# Patient Record
Sex: Male | Born: 1950 | Race: White | Hispanic: No | Marital: Single | Smoking: Never smoker
Health system: Southern US, Community
[De-identification: ages and names within clinical notes are randomized; demographics above are authoritative.]

## PROBLEM LIST (undated history)

## (undated) DIAGNOSIS — T148XXA Other injury of unspecified body region, initial encounter: Secondary | ICD-10-CM

## (undated) DIAGNOSIS — I82409 Acute embolism and thrombosis of unspecified deep veins of unspecified lower extremity: Secondary | ICD-10-CM

## (undated) DIAGNOSIS — T7840XA Allergy, unspecified, initial encounter: Secondary | ICD-10-CM

## (undated) HISTORY — DX: Allergy, unspecified, initial encounter: T78.40XA

## (undated) HISTORY — DX: Acute embolism and thrombosis of unspecified deep veins of unspecified lower extremity: I82.409

## (undated) HISTORY — PX: COLONOSCOPY: SHX174

## (undated) HISTORY — DX: Other injury of unspecified body region, initial encounter: T14.8XXA

---

## 1982-04-16 HISTORY — PX: SHOULDER SURGERY: SHX246

## 1999-04-17 HISTORY — PX: LASIK: SHX215

## 1999-11-20 ENCOUNTER — Ambulatory Visit (HOSPITAL_COMMUNITY): Admission: RE | Admit: 1999-11-20 | Discharge: 1999-11-20 | Payer: Self-pay | Admitting: Orthopaedic Surgery

## 2001-05-30 ENCOUNTER — Encounter: Admission: RE | Admit: 2001-05-30 | Discharge: 2001-05-30 | Payer: Self-pay | Admitting: Sports Medicine

## 2002-04-16 HISTORY — PX: KNEE ARTHROPLASTY: SHX992

## 2002-10-25 ENCOUNTER — Encounter: Payer: Self-pay | Admitting: Emergency Medicine

## 2002-10-25 ENCOUNTER — Emergency Department (HOSPITAL_COMMUNITY): Admission: EM | Admit: 2002-10-25 | Discharge: 2002-10-25 | Payer: Self-pay | Admitting: Emergency Medicine

## 2006-05-03 ENCOUNTER — Encounter: Payer: Self-pay | Admitting: Vascular Surgery

## 2006-05-03 ENCOUNTER — Ambulatory Visit (HOSPITAL_COMMUNITY): Admission: RE | Admit: 2006-05-03 | Discharge: 2006-05-03 | Payer: Self-pay | Admitting: Family Medicine

## 2006-05-16 ENCOUNTER — Ambulatory Visit: Payer: Self-pay | Admitting: Oncology

## 2006-09-13 ENCOUNTER — Ambulatory Visit: Payer: Self-pay | Admitting: Oncology

## 2006-09-17 ENCOUNTER — Ambulatory Visit: Admission: RE | Admit: 2006-09-17 | Discharge: 2006-09-17 | Payer: Self-pay | Admitting: Oncology

## 2006-09-17 ENCOUNTER — Ambulatory Visit: Payer: Self-pay | Admitting: Vascular Surgery

## 2006-09-19 LAB — CARDIOLIPIN ANTIBODIES, IGG, IGM, IGA: Anticardiolipin IgG: 8 [GPL'U] (ref <11)

## 2006-09-19 LAB — D-DIMER, QUANTITATIVE: D-Dimer, Quant: 0.22 ug/mL-FEU (ref 0.00–0.48)

## 2006-09-19 LAB — HOMOCYSTEINE: Homocysteine: 7.3 umol/L (ref 4.0–15.4)

## 2006-09-19 LAB — FACTOR 8 ASSAY: Coagulation Factor VIII: 388 % — ABNORMAL HIGH (ref 57–124)

## 2007-01-14 ENCOUNTER — Ambulatory Visit: Payer: Self-pay | Admitting: Oncology

## 2007-01-16 LAB — PROTIME-INR: Protime: 24 Seconds — ABNORMAL HIGH (ref 10.6–13.4)

## 2007-01-17 LAB — FACTOR 8 ASSAY: Coagulation Factor VIII: 166 % — ABNORMAL HIGH (ref 73–140)

## 2007-04-16 ENCOUNTER — Ambulatory Visit: Payer: Self-pay | Admitting: Oncology

## 2007-04-24 LAB — FACTOR 8 ASSAY: Coagulation Factor VIII: 133 % (ref 73–140)

## 2007-06-04 ENCOUNTER — Ambulatory Visit: Payer: Self-pay | Admitting: Oncology

## 2007-11-04 ENCOUNTER — Ambulatory Visit: Payer: Self-pay | Admitting: Oncology

## 2007-11-07 LAB — D-DIMER, QUANTITATIVE: D-Dimer, Quant: 0.5 ug/mL-FEU — ABNORMAL HIGH (ref 0.00–0.48)

## 2014-12-06 ENCOUNTER — Ambulatory Visit: Payer: Self-pay | Admitting: Family Medicine

## 2016-02-17 ENCOUNTER — Encounter: Payer: Self-pay | Admitting: Neurology

## 2016-02-17 ENCOUNTER — Ambulatory Visit (INDEPENDENT_AMBULATORY_CARE_PROVIDER_SITE_OTHER): Payer: BLUE CROSS/BLUE SHIELD | Admitting: Neurology

## 2016-02-17 VITALS — BP 119/80 | HR 70 | Ht 75.0 in | Wt 201.0 lb

## 2016-02-17 DIAGNOSIS — G510 Bell's palsy: Secondary | ICD-10-CM

## 2016-02-17 DIAGNOSIS — S069X9A Unspecified intracranial injury with loss of consciousness of unspecified duration, initial encounter: Secondary | ICD-10-CM | POA: Insufficient documentation

## 2016-02-17 DIAGNOSIS — H4921 Sixth [abducent] nerve palsy, right eye: Secondary | ICD-10-CM | POA: Diagnosis not present

## 2016-02-17 DIAGNOSIS — G509 Disorder of trigeminal nerve, unspecified: Secondary | ICD-10-CM | POA: Diagnosis not present

## 2016-02-17 DIAGNOSIS — S069XAA Unspecified intracranial injury with loss of consciousness status unknown, initial encounter: Secondary | ICD-10-CM | POA: Insufficient documentation
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
## 2017-04-12 ENCOUNTER — Ambulatory Visit
Admission: RE | Admit: 2017-04-12 | Discharge: 2017-04-12 | Disposition: A | Discharge status: Home / Self Care | Payer: Self-pay | Source: Ambulatory Visit | Attending: Family Medicine | Admitting: Family Medicine

## 2017-04-12 ENCOUNTER — Other Ambulatory Visit: Payer: Self-pay | Admitting: Family Medicine

## 2017-04-12 DIAGNOSIS — J111 Influenza due to unidentified influenza virus with other respiratory manifestations: Secondary | ICD-10-CM

## 2017-05-09 LAB — LIPID PANEL
Cholesterol: 174 (ref 0–200)
HDL: 77 — AB (ref 35–70)
Triglycerides: 75 (ref 40–160)

## 2017-05-09 LAB — BASIC METABOLIC PANEL
BUN: 20 (ref 4–21)
Creatinine: 1.1 (ref 0.6–1.3)
GLUCOSE: 94
Potassium: 4.5 (ref 3.4–5.3)
Sodium: 140 (ref 137–147)

## 2017-05-09 LAB — HEPATIC FUNCTION PANEL
ALK PHOS: 58 (ref 25–125)
ALT: 10 (ref 10–40)
AST: 19 (ref 14–40)
BILIRUBIN, TOTAL: 0.9

## 2017-10-29 LAB — CBC AND DIFFERENTIAL
NEUTROS ABS: 4
WBC: 6.1

## 2017-11-13 ENCOUNTER — Encounter: Payer: Self-pay | Admitting: Internal Medicine

## 2017-11-13 ENCOUNTER — Ambulatory Visit: Payer: BLUE CROSS/BLUE SHIELD | Admitting: Internal Medicine

## 2017-11-13 ENCOUNTER — Other Ambulatory Visit (INDEPENDENT_AMBULATORY_CARE_PROVIDER_SITE_OTHER): Payer: BLUE CROSS/BLUE SHIELD

## 2017-11-13 ENCOUNTER — Ambulatory Visit (INDEPENDENT_AMBULATORY_CARE_PROVIDER_SITE_OTHER)
Admission: RE | Admit: 2017-11-13 | Discharge: 2017-11-13 | Disposition: A | Discharge status: Home / Self Care | Payer: BLUE CROSS/BLUE SHIELD | Source: Ambulatory Visit | Attending: Internal Medicine | Admitting: Internal Medicine

## 2017-11-13 VITALS — BP 134/78 | HR 63 | Ht 75.0 in | Wt 198.6 lb

## 2017-11-13 DIAGNOSIS — R059 Cough, unspecified: Secondary | ICD-10-CM

## 2017-11-13 DIAGNOSIS — J45991 Cough variant asthma: Secondary | ICD-10-CM

## 2017-11-13 DIAGNOSIS — R05 Cough: Secondary | ICD-10-CM | POA: Diagnosis not present

## 2017-11-13 LAB — CBC WITH DIFFERENTIAL/PLATELET
BASOS PCT: 0.5 % (ref 0.0–3.0)
Basophils Absolute: 0 10*3/uL (ref 0.0–0.1)
EOS ABS: 0.1 10*3/uL (ref 0.0–0.7)
Eosinophils Relative: 1.4 % (ref 0.0–5.0)
HCT: 42.9 % (ref 39.0–52.0)
HEMOGLOBIN: 14.7 g/dL (ref 13.0–17.0)
LYMPHS PCT: 29.8 % (ref 12.0–46.0)
Lymphs Abs: 1.9 10*3/uL (ref 0.7–4.0)
MCHC: 34.3 g/dL (ref 30.0–36.0)
MCV: 89.5 fl (ref 78.0–100.0)
Monocytes Absolute: 0.4 10*3/uL (ref 0.1–1.0)
Monocytes Relative: 7.1 % (ref 3.0–12.0)
NEUTROS ABS: 3.9 10*3/uL (ref 1.4–7.7)
Neutrophils Relative %: 61.2 % (ref 43.0–77.0)
PLATELETS: 159 10*3/uL (ref 150.0–400.0)
RBC: 4.8 Mil/uL (ref 4.22–5.81)
RDW: 14.3 % (ref 11.5–15.5)
WBC: 6.3 10*3/uL (ref 4.0–10.5)

## 2017-11-13 LAB — NITRIC OXIDE: Nitric Oxide: 19

## 2017-11-13 MED ORDER — BUDESONIDE-FORMOTEROL FUMARATE 160-4.5 MCG/ACT IN AERO: 2.0000 | INHALATION_SPRAY | Freq: Two times a day (BID) | RESPIRATORY_TRACT | 0 refills | Status: DC

## 2017-11-13 NOTE — Patient Instructions (Addendum)
symbiocort 160 2 pffs just at bedtime  Work on inhaler technique:  relax and gently blow all the way out then take a nice smooth deep breath back in, triggering the inhaler at same time you start breathing in.  Hold for up to 5 seconds if you can. Blow out thru nose. Rinse and gargle with water when done     Please see patient coordinator before you leave today  to schedule Ct Sinus and I will call results    Please remember to go to the lab and x-ray department downstairs in the basement  for your tests - we will call you with the results when they are available.   Please schedule a follow up office visit in 4 weeks, sooner if needed
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
## 2017-11-14 ENCOUNTER — Encounter: Payer: Self-pay | Admitting: Internal Medicine

## 2017-11-14 LAB — RESPIRATORY ALLERGY PROFILE REGION II ~~LOC~~
Allergen, D pternoyssinus,d7: <0.1 kU/L
Allergen, Mouse Urine Protein, e78: <0.1 kU/L
Allergen, Oak,t7: <0.1 kU/L
Allergen, P. notatum, m1: <0.1 kU/L
Box Elder IgE: <0.1 kU/L
CLASS: 0
CLASS: 0
CLASS: 0
CLASS: 0
CLASS: 0
CLASS: 0
CLASS: 0
CLASS: 0
CLASS: 0
CLASS: 0
CLASS: 0
CLASS: 0
CLASS: 0
Class: 0
Class: 0
Class: 0
Class: 0
Class: 0
Class: 0
Class: 0
Class: 0
Class: 0
Class: 0
Class: 0
Cockroach: <0.1 kU/L
D. farinae: <0.1 kU/L
Elm IgE: <0.1 kU/L
IgE (Immunoglobulin E), Serum: <2 kU/L (ref <=114)
Johnson Grass: <0.1 kU/L
Timothy Grass: <0.1 kU/L

## 2017-11-14 LAB — INTERPRETATION:
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
## 2017-11-14 NOTE — Progress Notes (Signed)
Spoke with pt and notified of results per Dr. Wert. Pt verbalized understanding and denied any questions. 

## 2017-11-22 ENCOUNTER — Ambulatory Visit (INDEPENDENT_AMBULATORY_CARE_PROVIDER_SITE_OTHER)
Admission: RE | Admit: 2017-11-22 | Discharge: 2017-11-22 | Disposition: A | Discharge status: Home / Self Care | Payer: Medicare HMO | Source: Ambulatory Visit | Attending: Internal Medicine | Admitting: Internal Medicine

## 2017-11-22 DIAGNOSIS — J0100 Acute maxillary sinusitis, unspecified: Secondary | ICD-10-CM | POA: Diagnosis not present

## 2017-11-22 DIAGNOSIS — J45991 Cough variant asthma: Secondary | ICD-10-CM | POA: Diagnosis not present

## 2017-11-22 DIAGNOSIS — R05 Cough: Secondary | ICD-10-CM

## 2017-11-22 DIAGNOSIS — R059 Cough, unspecified: Secondary | ICD-10-CM

## 2017-11-22 NOTE — Progress Notes (Signed)
Spoke with pt and notified of results per Dr. Wert. Pt verbalized understanding and denied any questions. 

## 2017-12-12 ENCOUNTER — Encounter: Payer: Self-pay | Admitting: Internal Medicine

## 2017-12-12 ENCOUNTER — Telehealth: Payer: Self-pay | Admitting: Internal Medicine

## 2017-12-12 ENCOUNTER — Ambulatory Visit: Payer: BLUE CROSS/BLUE SHIELD | Admitting: Internal Medicine

## 2017-12-12 VITALS — BP 120/64 | HR 64 | Ht 73.75 in | Wt 192.2 lb

## 2017-12-12 DIAGNOSIS — Z23 Encounter for immunization: Secondary | ICD-10-CM

## 2017-12-12 DIAGNOSIS — Z0000 Encounter for general adult medical examination without abnormal findings: Secondary | ICD-10-CM

## 2017-12-12 DIAGNOSIS — J45991 Cough variant asthma: Secondary | ICD-10-CM

## 2017-12-12 MED ORDER — BUDESONIDE-FORMOTEROL FUMARATE 80-4.5 MCG/ACT IN AERO: INHALATION_SPRAY | RESPIRATORY_TRACT | 12 refills | Status: DC

## 2017-12-12 NOTE — Telephone Encounter (Signed)
Clinical staff did not call this patient today.  Pt did have a referral placed for PCP today- PCCs please advise if you contacted pt regarding this. I see no other reason why our office would have called pt today.  Thanks!

## 2017-12-12 NOTE — Patient Instructions (Addendum)
Symbicort 80 up to 2 puffs every 12 hours as needed    For cough > mucinex dm up to 1200 mg every 12 hours as needed   Please see patient coordinator before you leave today  to schedule internal medicine for health maintence   You will be given a regular flu shot and Prevnar 13 today    If you are satisfied with your treatment plan,  let your doctor know and he/she can either refill your medications or you can return here when your prescription runs out.     If in any way you are not 100% satisfied,  please tell us.  If 100% better, tell your friends!  Pulmonary follow up is as needed
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
## 2017-12-12 NOTE — Telephone Encounter (Signed)
Spoke to pt he is aware he will here from [facility] for an appt with them he is also aware his sinus ct has been rebilled Tobe SosSally E Ottinger

## 2017-12-16 ENCOUNTER — Encounter: Payer: Self-pay | Admitting: Internal Medicine

## 2017-12-16 NOTE — Assessment & Plan Note (Signed)
FENO 11/13/2017  =   19 - Spirometry 11/13/2017   wnl - 11/13/2017  try symb 160 just at bedtime x 4 weeks then return > pt changed to prn symbicort on his own - Allergy profile 11/13/2017 >  Eos 0.1  /  IgE  < 2   Rast neg   - Sinus CT 11/22/2017 >>> Mild left frontal sinus mucosal thickening.  - 12/12/2017  After extensive coaching inhaler device,  effectiveness =  90%    Marked improvement using symbicort "prn"  This is actually reasonable  Based on two studies from NEJM  378; 20 p 1865 (2018) and 380 : p2020-30 (2019) in pts with mild asthma it is reasonable to use low dose symbicort eg 80 2bid "prn" flare in this setting but I emphasized this was only shown with symbicort and takes advantage of the rapid onset of action but is not the same as "rescue therapy" but can be stopped once the acute symptoms have resolved and the need for rescue has been minimized (< 2 x weekly)    Advised pulmonary f/u can be prn at this point but needs to establish with PCP > referred    I had an extended discussion with the patient reviewing all relevant studies completed to date and  lasting 15 to 20 minutes of a 25 minute visit    See device teaching which extended face to face time for this visit.  Each maintenance medication was reviewed in detail including emphasizing most importantly the difference between maintenance and prns and under what circumstances the prns are to be triggered using an action plan format that is not reflected in the computer generated alphabetically organized AVS which I have not found useful in most complex patients, especially with respiratory illnesses  Please see AVS for specific instructions unique to this visit that I personally wrote and verbalized to the the pt in detail and then reviewed with pt  by my nurse highlighting any  changes in therapy recommended at today's visit to their plan of care.

## 2017-12-16 NOTE — Assessment & Plan Note (Signed)
Referred to [facility] primary care.

## 2017-12-27 DIAGNOSIS — R69 Illness, unspecified: Secondary | ICD-10-CM | POA: Diagnosis not present

## 2018-03-05 ENCOUNTER — Telehealth: Payer: Self-pay | Admitting: Internal Medicine

## 2018-03-05 NOTE — Telephone Encounter (Signed)
Dr. Sherene SiresWert would not process these labs.  Routing to [place] to follow up on.

## 2018-03-10 NOTE — Telephone Encounter (Signed)
Spoke with Marisue IvanLiz in regards to the claim to see if anything needed to be done by us and per Marisue IvanLiz, if the labs were for the respiratory allergy profile that MW ordered, we would need Dr. Sherene SiresWert to write a brief note explaining why he ordered the number of tests he did (that there are many potential allergens that we need to test for). Once this has been done, Marisue IvanLiz will fax that to the lab and request that they appeal this claim for pt.  Dr. Sherene SiresWert, please advise on this for pt. Thanks!

## 2018-03-12 NOTE — Telephone Encounter (Signed)
Dr. Sherene SiresWert please advise once available, thank you.

## 2018-03-17 ENCOUNTER — Encounter: Payer: Self-pay | Admitting: Internal Medicine

## 2018-03-17 NOTE — Telephone Encounter (Signed)
Dr. Wert, please advise on this. Thanks! 

## 2018-03-17 NOTE — Telephone Encounter (Signed)
done

## 2018-03-18 NOTE — Telephone Encounter (Signed)
Marisue IvanLiz, did MW pass the letter to you for patient's labs. Thanks.

## 2018-03-24 NOTE — Telephone Encounter (Signed)
Have not seen any follow up on this patient looks like Dr. Sherene SiresWert has addressed, just need confirmation from Marisue IvanLiz to close message   Will route to her for follow up

## 2018-03-24 NOTE — Telephone Encounter (Signed)
Attempted to call patient, left message.  

## 2018-03-25 NOTE — Telephone Encounter (Signed)
Marisue IvanLiz please advise once you hear back from the patient, thank you.

## 2018-03-28 NOTE — Telephone Encounter (Signed)
Marisue IvanLiz do you have any update regarding this. Thanks.

## 2018-04-02 NOTE — Telephone Encounter (Signed)
Marisue IvanLiz, please advise on this for pt if it has been taken care of. Thanks!

## 2018-04-03 NOTE — Telephone Encounter (Signed)
Recd letter from Dr. Sherene SiresWert, called [facility], s/w Imagene GurneyDariana who gave me fax# [phone], faxed letter to her attention with invoice #[id number]#[phone].

## 2018-04-07 NOTE — Telephone Encounter (Signed)
Wesley Black, is it ok to close this encounter?

## 2018-04-14 ENCOUNTER — Encounter: Payer: Self-pay | Admitting: Family Medicine

## 2018-04-14 ENCOUNTER — Ambulatory Visit (INDEPENDENT_AMBULATORY_CARE_PROVIDER_SITE_OTHER): Payer: Medicare HMO | Admitting: Family Medicine

## 2018-04-14 VITALS — BP 112/72 | HR 54 | Temp 97.6°F | Ht 75.0 in | Wt 196.0 lb

## 2018-04-14 DIAGNOSIS — R229 Localized swelling, mass and lump, unspecified: Secondary | ICD-10-CM

## 2018-04-14 DIAGNOSIS — J329 Chronic sinusitis, unspecified: Secondary | ICD-10-CM

## 2018-04-14 DIAGNOSIS — S069X9S Unspecified intracranial injury with loss of consciousness of unspecified duration, sequela: Secondary | ICD-10-CM | POA: Diagnosis not present

## 2018-04-14 DIAGNOSIS — L739 Follicular disorder, unspecified: Secondary | ICD-10-CM | POA: Insufficient documentation

## 2018-04-14 DIAGNOSIS — R05 Cough: Secondary | ICD-10-CM

## 2018-04-14 DIAGNOSIS — L080 Pyoderma: Secondary | ICD-10-CM | POA: Diagnosis not present

## 2018-04-14 DIAGNOSIS — R059 Cough, unspecified: Secondary | ICD-10-CM | POA: Insufficient documentation

## 2018-04-14 MED ORDER — AZELASTINE-FLUTICASONE 137-50 MCG/ACT NA SUSP: NASAL | 1 refills | Status: DC

## 2018-04-14 NOTE — Progress Notes (Signed)
Subjective:  Wesley Black is a 67 y.o. male who presents today with a chief complaint of sinus congestion and to establish care.  HPI:  Sinus Congestion, chronic problem, new to provider Patient symptoms started several months ago.  Symptoms initially had URI symptoms with cough and congestion.  He was treated with Z-Pak without clear resolution of the symptoms.  A few months later he had persistent cough and was started on Tessalon with no improvement.  He was subsequently referred to pulmonology for chronic cough.  Underwent work-up at that time including chest x-ray which was negative.  He had PFTs done as well which were also negative.  Denies any shortness of breath.  Cough is typically not productive of any sputum.  No fevers.  No chills.  No weight loss.  No orthopnea.  No reflux type symptoms.  Reportedly, his pulmonologist was concerned that he may have sinus issues contributing to his chronic cough.  Patient underwent CT scan of his face which showed mild left frontal sinus mucosal thickening.  Symptoms have been persistent over the last couple of months.  No other treatments tried.  Cyst, chronic problem, new to provider Patient also reports having cysts appear in his bilateral armpits randomly over the past year.  Initially started in his left axilla over a year ago.  He was treated with doxycycline and his symptoms resolved for a couple weeks.  Less the symptoms came back he was treated with topical antibiotic ointment with improvement in his symptoms.  Several months after this, had recurrence of symptoms in his right axilla.  ROS: Per HPI, otherwise a complete review of systems was negative.   PMH:  The following were reviewed and entered/updated in epic: Past Medical History:  Diagnosis Date  . Allergy   . Fracture    Multiple   Patient Active Problem List   Diagnosis Date Noted  . Cough 04/14/2018  . Skin nodule 04/14/2018  . 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 KNEE ARTHROPLASTY Right 2004  . LASIK Bilateral 2001  . SHOULDER SURGERY Left 1984    Family History  Problem Relation Age of Onset  . Hypertension Mother   . Arthritis Mother   . Hyperlipidemia Mother   . Prostate cancer Father   . Cancer Father   . Hyperlipidemia Father   . Hypertension Father   . Prostate cancer Brother   . Obesity Brother   . Asthma Maternal Grandmother   . Hyperlipidemia Maternal Grandfather   . Hypertension Maternal Grandfather   . Stroke Maternal Grandfather   . COPD Paternal Grandfather     Medications- reviewed and updated Current Outpatient Medications  Medication Sig Dispense Refill  . b complex vitamins tablet Take 1 tablet by mouth daily.    . budesonide-formoterol (SYMBICORT) 80-4.5 MCG/ACT inhaler Take 2 puffs first thing in am and then another 2 puffs about 12 hours later. 1 Inhaler 12  . Multiple Vitamin (MULTIVITAMIN) capsule Take 1 capsule by mouth daily.    . Azelastine-Fluticasone (DYMISTA) 137-50 MCG/ACT SUSP Use one spray per nostril twice dauly. 23 g 1   No current facility-administered medications for this visit.     Allergies-reviewed and updated No Known Allergies  Social History   Socioeconomic History  . Marital status: Married    Spouse name: Not on file  . Number of children: 0  . Years of education: Master's  .  Highest education level: Not on file  Occupational History  . Occupation: Retired  Engineer, productionocial Needs  . Financial resource strain: Not on file  . Food insecurity:    Worry: Not on file    Inability: Not on file  . Transportation needs:    Medical: Not on file    Non-medical: Not on file  Tobacco Use  . Smoking status: Never Smoker  . Smokeless tobacco: Never Used  Substance and Sexual Activity  . Alcohol use: Yes    Comment: 2 glasses of wine or beer per month  . Drug use: No  . Sexual activity: Not on file    Comment:  Married  Lifestyle  . Physical activity:    Days per week: Not on file    Minutes per session: Not on file  . Stress: Not on file  Relationships  . Social connections:    Talks on phone: Not on file    Gets together: Not on file    Attends religious service: Not on file    Active member of club or organization: Not on file    Attends meetings of clubs or organizations: Not on file    Relationship status: Not on file  Other Topics Concern  . Not on file  Social History Narrative   Lives at home, separated   Left-handed   Caffeine: 1 glass of tea per day, occasional 7 oz Pepsi (every 2 wks)        Objective:  Physical Exam: BP 112/72 (BP Location: Left Arm, Patient Position: Sitting, Cuff Size: Normal)   Pulse (!) 54   Temp 97.6 F (36.4 C) (Oral)   Ht 6\' 3"  (1.905 m)   Wt 196 lb (88.9 kg)   SpO2 97%   BMI 24.50 kg/m   Gen: NAD, resting comfortably HEENT: TMs clear.  Nasal mucosa normal-appearing.  Mild leftward septal deviation noted. CV: RRR with no murmurs appreciated Pulm: NWOB, CTAB with no crackles, wheezes, or rhonchi GI: Normal bowel sounds present. Soft, Nontender, Nondistended. MSK: No edema, cyanosis, or clubbing noted Skin: 1.5 cm indurated, erythematous nodule in right axilla.  Tender to palpation.  No fluctuance. Neuro: Grossly normal, moves all extremities Psych: Normal affect and thought content  Punch Biopsy Procedure Note After informed written consent was obtained, using Betadine for cleansing and 1% Lidocaine with epinephrine for anesthetic, with sterile technique a 4 mm punch biopsy was used to obtain a biopsy specimen of the lesion. Hemostasis was obtained by silver nitrate and pressure.  Wound was not sutured. Antibiotic dressing is applied, and wound care instructions provided. Be alert for any signs of cutaneous infection. The specimen is labeled and sent to pathology for evaluation. The procedure was well tolerated without  complications.  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  Do not think this is contributing to his other symptoms.  Skin nodule Punch biopsy performed today.  Depending on results may need surgical referral for excision versus ultrasonography.  Cough Multifactorial in setting of cough variant asthma and possible chronic sinusitis.  Will start Dymista nasal spray.  He will continue Symbicort as directed by pulmonology.  Preventative Healthcare Patient was instructed to return soon for CPE.  Obtain records from previous PCP.  Health Maintenance Due  Topic Date Due  . Hepatitis C Screening  09/16/1950  . HIV Screening  12/07/1967  . TETANUS/TDAP  12/07/1971  . COLONOSCOPY  01/23/2011   Katina Degreealeb M. Jimmey RalphParker, MD 04/14/2018 2:17 PM

## 2018-04-14 NOTE — Assessment & Plan Note (Signed)
Punch biopsy performed today.  Depending on results may need surgical referral for excision versus ultrasonography.

## 2018-04-14 NOTE — Assessment & Plan Note (Signed)
Multifactorial in setting of cough variant asthma and possible chronic sinusitis.  Will start Dymista nasal spray.  He will continue Symbicort as directed by pulmonology.

## 2018-04-14 NOTE — Patient Instructions (Addendum)
It was very nice to see you today!  We will take a biopsy of the spot in your armpit. This will come back in about a week or so.  Please keep the area clean and dry.  Please leave the bandage in place for the next 24 hours.  Please try the dymista spray for your sinuses and cough. Let me know if your symptoms do not improve in the next 1-2 weeks.   Take care, Dr Jimmey RalphParker   Skin Biopsy, Care After This sheet gives you information about how to care for yourself after your procedure. Your health care provider may also give you more specific instructions. If you have problems or questions, contact your health care provider. What can I expect after the procedure? After the procedure, it is common to have:  Soreness.  Bruising.  Itching. Follow these instructions at home: Biopsy site care Follow instructions from your health care provider about how to take care of your biopsy site. Make sure you:  Wash your hands with soap and water before and after you change your bandage (dressing). If soap and water are not available, use hand sanitizer.  Apply ointment on your biopsy site as directed by your health care provider.  Change your dressing as told by your health care provider.  Leave stitches (sutures), skin glue, or adhesive strips in place. These skin closures may need to stay in place for 2 weeks or longer. If adhesive strip edges start to loosen and curl up, you may trim the loose edges. Do not remove adhesive strips completely unless your health care provider tells you to do that.  If the biopsy area bleeds, apply gentle pressure for 10 minutes. Check your biopsy site every day for signs of infection. Check for:  Redness, swelling, or pain.  Fluid or blood.  Warmth.  Pus or a bad smell.  General instructions  Rest and then return to your normal activities as told by your health care provider.  Take over-the-counter and prescription medicines only as told by your health care  provider.  Keep all follow-up visits as told by your health care provider. This is important. Contact a health care provider if:  You have redness, swelling, or pain around your biopsy site.  You have fluid or blood coming from your biopsy site.  Your biopsy site feels warm to the touch.  You have pus or a bad smell coming from your biopsy site.  You have a fever.  Your sutures, skin glue, or adhesive strips loosen or come off sooner than expected. Get help right away if:  You have bleeding that does not stop with pressure or a dressing. Summary  After the procedure, it is common to have soreness, bruising, and itching at the site.  Follow instructions from your health care provider about how to take care of your biopsy site.  Check your biopsy site every day for signs of infection.  Contact a health care provider if you have redness, swelling, or pain around your biopsy site, or your biopsy site feels warm to the touch.  Keep all follow-up visits as told by your health care provider. This is important. This information is not intended to replace advice given to you by your health care provider. Make sure you discuss any questions you have with your health care provider. Document Released: 04/29/2015 Document Revised: 09/30/2017 Document Reviewed: 09/30/2017 Elsevier Interactive Patient Education  [employer]2019 [employer]

## 2018-04-14 NOTE — Assessment & Plan Note (Signed)
Stable.  Do not think this is contributing to his other symptoms.

## 2018-04-17 ENCOUNTER — Telehealth: Payer: Self-pay | Admitting: Family Medicine

## 2018-04-17 MED ORDER — AZELASTINE HCL 0.1 % NA SOLN: 2.0000 | Freq: Two times a day (BID) | NASAL | 12 refills | Status: DC

## 2018-04-17 NOTE — Telephone Encounter (Signed)
See note  Copied from CRM [id number]. Topic: General - Other >> Apr 15, 2018  3:45 PM Jaquita Rector A wrote: Reason for CRM: Patient called to say that upon Dr [name] writing Rx on 04/14/18 for Azelastine-Fluticasone [facility]) 137-50 MCG/ACT SUSP he knew it may not be covered so he told patient to call back for new Rx if any issues. Per patient pharmacy will not fill because insurance refuse to pay for it. Please advise.

## 2018-04-17 NOTE — Telephone Encounter (Signed)
Requesting new medication due to Rx not covered.

## 2018-04-17 NOTE — Addendum Note (Signed)
Addended by: Ardith Dark on: 04/17/2018 12:20 PM   Modules accepted: Orders

## 2018-04-18 NOTE — Progress Notes (Signed)
Please inform patient of the following:  His biopsy shed that the spots he is having is due to small areas of infected hair follicles. Do not necessarily need to change his treatment plan at this time. He can continue using topical antibiotic ointment as needed.  Would like for him to let us know if they become more frequent than they have been or if they change in any other way.  Katina Degree. [name], MD 04/18/2018 12:06 PM

## 2018-04-25 ENCOUNTER — Telehealth: Payer: Self-pay | Admitting: Family Medicine

## 2018-04-25 NOTE — Telephone Encounter (Signed)
Patient came by and just wanted to let Dr. [name] know that he got another node under his arm pit on 04/22/18 and was told to let him know if he got more so he just wanted to inform him.

## 2018-04-25 NOTE — Telephone Encounter (Signed)
Noted. Would like for him to continue using hibiclens and topical ointment.  Katina Degree. [name], MD 04/25/2018 12:57 PM

## 2018-04-25 NOTE — Telephone Encounter (Signed)
FYI

## 2018-04-25 NOTE — Telephone Encounter (Signed)
Notified patient he voices understanding 

## 2018-05-01 ENCOUNTER — Encounter: Payer: Self-pay | Admitting: Physical Therapy

## 2018-07-08 DIAGNOSIS — N402 Nodular prostate without lower urinary tract symptoms: Secondary | ICD-10-CM | POA: Diagnosis not present

## 2018-07-11 DIAGNOSIS — N5201 Erectile dysfunction due to arterial insufficiency: Secondary | ICD-10-CM | POA: Diagnosis not present

## 2018-07-11 DIAGNOSIS — N402 Nodular prostate without lower urinary tract symptoms: Secondary | ICD-10-CM | POA: Diagnosis not present

## 2018-10-27 ENCOUNTER — Ambulatory Visit (INDEPENDENT_AMBULATORY_CARE_PROVIDER_SITE_OTHER): Payer: Medicare HMO | Admitting: Family Medicine

## 2018-10-27 ENCOUNTER — Encounter: Payer: Self-pay | Admitting: Family Medicine

## 2018-10-27 DIAGNOSIS — R42 Dizziness and giddiness: Secondary | ICD-10-CM

## 2018-10-27 DIAGNOSIS — H9190 Unspecified hearing loss, unspecified ear: Secondary | ICD-10-CM

## 2018-10-27 NOTE — Progress Notes (Signed)
   Chief Complaint:  Wesley Black is a 68 y.o. male who presents today for a virtual office visit with a chief complaint of dizziness.   Assessment/Plan:  Vertigo No red flags.   Likely BPPV however, given his hearing loss will place referral to ENT for further evaluation/management.  No tinnitus-doubt Mnire's disease, however this is still on differential.  Preventative Healthcare Patient was instructed to return soon for CPE. Health Maintenance Due  Topic Date Due  . Hepatitis C Screening  [birth year]  . HIV Screening  12/07/1967  . TETANUS/TDAP  12/07/1971  . COLONOSCOPY  01/23/2011     Subjective:  HPI:  Dizziness Started about a week ago.  Had couple of episodes of room spinning sensation when getting out of bed and when going from sitting to standing.  Each episode lasted for 10 to 15 seconds and then subsided.  Over the following days he also had some right-sided hearing loss as well.  Symptoms been stable over that time.  He has noticed that the episodes become more frequent.  Now occasionally occur when turning his head from left to right.  No numbness.  No reported vision changes. No other obvious alleviating or aggravating factors.   ROS: Per HPI  PMH: He reports that he has never smoked. He has never used smokeless tobacco. He reports current alcohol use. He reports that he does not use drugs.      Objective/Observations  Physical Exam: Gen: NAD, resting comfortably Pulm: Normal work of breathing Neuro: Grossly normal, moves all extremities Psych: Normal affect and thought content  Virtual Visit via Video   I connected with Wesley Black on 10/27/18 at  4:20 PM EDT by a video enabled telemedicine application and verified that I am speaking with the correct person using two identifiers. I discussed the limitations of evaluation and management by telemedicine and the availability of in person appointments. The patient expressed understanding and agreed to proceed.   Patient location: Home Provider location: [place] participating in the virtual visit: Myself and Patient     Algis Greenhouse. Jerline Pain, MD 10/27/2018 4:14 PM

## 2018-11-11 ENCOUNTER — Encounter: Payer: Medicare HMO | Admitting: Family Medicine

## 2018-11-18 ENCOUNTER — Other Ambulatory Visit: Payer: Self-pay

## 2018-11-18 ENCOUNTER — Encounter: Payer: Self-pay | Admitting: Family Medicine

## 2018-11-18 ENCOUNTER — Ambulatory Visit (INDEPENDENT_AMBULATORY_CARE_PROVIDER_SITE_OTHER): Payer: Medicare HMO | Admitting: Family Medicine

## 2018-11-18 VITALS — BP 108/66 | HR 56 | Temp 98.1°F | Ht 75.0 in | Wt 191.2 lb

## 2018-11-18 DIAGNOSIS — S069X9S Unspecified intracranial injury with loss of consciousness of unspecified duration, sequela: Secondary | ICD-10-CM | POA: Diagnosis not present

## 2018-11-18 DIAGNOSIS — Z1211 Encounter for screening for malignant neoplasm of colon: Secondary | ICD-10-CM

## 2018-11-18 DIAGNOSIS — Z1159 Encounter for screening for other viral diseases: Secondary | ICD-10-CM

## 2018-11-18 DIAGNOSIS — R6889 Other general symptoms and signs: Secondary | ICD-10-CM

## 2018-11-18 DIAGNOSIS — Z0001 Encounter for general adult medical examination with abnormal findings: Secondary | ICD-10-CM

## 2018-11-18 DIAGNOSIS — Z23 Encounter for immunization: Secondary | ICD-10-CM | POA: Diagnosis not present

## 2018-11-18 DIAGNOSIS — Z13220 Encounter for screening for lipoid disorders: Secondary | ICD-10-CM

## 2018-11-18 DIAGNOSIS — M25651 Stiffness of right hip, not elsewhere classified: Secondary | ICD-10-CM | POA: Diagnosis not present

## 2018-11-18 DIAGNOSIS — I8393 Asymptomatic varicose veins of bilateral lower extremities: Secondary | ICD-10-CM

## 2018-11-18 DIAGNOSIS — R531 Weakness: Secondary | ICD-10-CM | POA: Diagnosis not present

## 2018-11-18 LAB — COMPREHENSIVE METABOLIC PANEL
ALT: 12 U/L (ref 0–53)
AST: 20 U/L (ref 0–37)
Albumin: 4.3 g/dL (ref 3.5–5.2)
Alkaline Phosphatase: 58 U/L (ref 39–117)
BUN: 20 mg/dL (ref 6–23)
CO2: 30 mEq/L (ref 19–32)
Calcium: 9.3 mg/dL (ref 8.4–10.5)
Chloride: 102 mEq/L (ref 96–112)
Creatinine, Ser: 1.05 mg/dL (ref 0.40–1.50)
GFR: 70.68 mL/min (ref >60.00)
Glucose, Bld: 88 mg/dL (ref 70–99)
Potassium: 4.4 mEq/L (ref 3.5–5.1)
Sodium: 138 mEq/L (ref 135–145)
Total Bilirubin: 0.7 mg/dL (ref 0.2–1.2)
Total Protein: 6.7 g/dL (ref 6.0–8.3)

## 2018-11-18 LAB — CBC
HCT: 42.7 % (ref 39.0–52.0)
Hemoglobin: 14.7 g/dL (ref 13.0–17.0)
MCHC: 34.4 g/dL (ref 30.0–36.0)
MCV: 91.6 fl (ref 78.0–100.0)
Platelets: 135 10*3/uL — ABNORMAL LOW (ref 150.0–400.0)
RBC: 4.66 Mil/uL (ref 4.22–5.81)
RDW: 13.8 % (ref 11.5–15.5)
WBC: 4.1 10*3/uL (ref 4.0–10.5)

## 2018-11-18 LAB — LIPID PANEL
Cholesterol: 175 mg/dL (ref 0–200)
HDL: 68.1 mg/dL (ref >39.00)
LDL Cholesterol: 88 mg/dL (ref 0–99)
NonHDL: 107.25
Total CHOL/HDL Ratio: 3
Triglycerides: 98 mg/dL (ref 0.0–149.0)
VLDL: 19.6 mg/dL (ref 0.0–40.0)

## 2018-11-18 LAB — TSH: TSH: 0.85 u[IU]/mL (ref 0.35–4.50)

## 2018-11-18 NOTE — Assessment & Plan Note (Signed)
Stable.  Unclear if this contributed to his episode of weakness.  We will continue to monitor.
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
## 2018-11-18 NOTE — Patient Instructions (Signed)
It was very nice to see you today!  We will check blood work today.  Let me know if your symptoms return.   Come back in 1 year.   Take care, Dr Jerline Pain  Please try these tips to maintain a healthy lifestyle:   Eat at least 3 REAL meals and 1-2 snacks per day.  Aim for no more than 5 hours between eating.  If you eat breakfast, please do so within one hour of getting up.    Obtain twice as many fruits/vegetables as protein or carbohydrate foods for both lunch and dinner. (Half of each meal should be fruits/vegetables, one quarter protein, and one quarter starchy carbs)   Cut down on sweet beverages. This includes juice, soda, and sweet tea.    Exercise at least 150 minutes every week.    Preventive Care 50 Years and Older, Male Preventive care refers to lifestyle choices and visits with your health care provider that can promote health and wellness. This includes:  A yearly physical exam. This is also called an annual well check.  Regular dental and eye exams.  Immunizations.  Screening for certain conditions.  Healthy lifestyle choices, such as diet and exercise. What can I expect for my preventive care visit? Physical exam Your health care provider will check:  Height and weight. These may be used to calculate body mass index (BMI), which is a measurement that tells if you are at a healthy weight.  Heart rate and blood pressure.  Your skin for abnormal spots. Counseling Your health care provider may ask you questions about:  Alcohol, tobacco, and drug use.  Emotional well-being.  Home and relationship well-being.  Sexual activity.  Eating habits.  History of falls.  Memory and ability to understand (cognition).  Work and work Statistician. What immunizations do I need?  Influenza (flu) vaccine  This is recommended every year. Tetanus, diphtheria, and pertussis (Tdap) vaccine  You may need a Td booster every 10 years. Varicella (chickenpox)  vaccine  You may need this vaccine if you have not already been vaccinated. Zoster (shingles) vaccine  You may need this after age 28. Pneumococcal conjugate (PCV13) vaccine  One dose is recommended after age 87. Pneumococcal polysaccharide (PPSV23) vaccine  One dose is recommended after age 41. Measles, mumps, and rubella (MMR) vaccine  You may need at least one dose of MMR if you were born in 1957 or later. You may also need a second dose. Meningococcal conjugate (MenACWY) vaccine  You may need this if you have certain conditions. Hepatitis A vaccine  You may need this if you have certain conditions or if you travel or work in places where you may be exposed to hepatitis A. Hepatitis B vaccine  You may need this if you have certain conditions or if you travel or work in places where you may be exposed to hepatitis B. Haemophilus influenzae type b (Hib) vaccine  You may need this if you have certain conditions. You may receive vaccines as individual doses or as more than one vaccine together in one shot (combination vaccines). Talk with your health care provider about the risks and benefits of combination vaccines. What tests do I need? Blood tests  Lipid and cholesterol levels. These may be checked every 5 years, or more frequently depending on your overall health.  Hepatitis C test.  Hepatitis B test. Screening  Lung cancer screening. You may have this screening every year starting at age 57 if you have a 30-pack-year history  of smoking and currently smoke or have quit within the past 15 years.  Colorectal cancer screening. All adults should have this screening starting at age 54 and continuing until age 31. Your health care provider may recommend screening at age 12 if you are at increased risk. You will have tests every 1-10 years, depending on your results and the type of screening test.  Prostate cancer screening. Recommendations will vary depending on your family  history and other risks.  Diabetes screening. This is done by checking your blood sugar (glucose) after you have not eaten for a while (fasting). You may have this done every 1-3 years.  Abdominal aortic aneurysm (AAA) screening. You may need this if you are a current or former smoker.  Sexually transmitted disease (STD) testing. Follow these instructions at home: Eating and drinking  Eat a diet that includes fresh fruits and vegetables, whole grains, lean protein, and low-fat dairy products. Limit your intake of foods with high amounts of sugar, saturated fats, and salt.  Take vitamin and mineral supplements as recommended by your health care provider.  Do not drink alcohol if your health care provider tells you not to drink.  If you drink alcohol: ? Limit how much you have to 0-2 drinks a day. ? Be aware of how much alcohol is in your drink. In the [place] one drink equals one 12 oz bottle of beer (355 mL), one 5 oz glass of wine (148 mL), or one 1 oz glass of hard liquor (44 mL). Lifestyle  Take daily care of your teeth and gums.  Stay active. Exercise for at least 30 minutes on 5 or more days each week.  Do not use any products that contain nicotine or tobacco, such as cigarettes, e-cigarettes, and chewing tobacco. If you need help quitting, ask your health care provider.  If you are sexually active, practice safe sex. Use a condom or other form of protection to prevent STIs (sexually transmitted infections).  Talk with your health care provider about taking a low-dose aspirin or statin. What's next?  Visit your health care provider once a year for a well check visit.  Ask your health care provider how often you should have your eyes and teeth checked.  Stay up to date on all vaccines. This information is not intended to replace advice given to you by your health care provider. Make sure you discuss any questions you have with your health care provider. Document Released:  04/29/2015 Document Revised: 03/27/2018 Document Reviewed: 03/27/2018 Elsevier Patient Education  2020 [employer].

## 2018-11-19 LAB — HEPATITIS C ANTIBODY
Hepatitis C Ab: NONREACTIVE
SIGNAL TO CUT-OFF: 0.02 (ref <1.00)

## 2018-11-19 NOTE — Progress Notes (Signed)
Please inform patient of the following:  Blood work is all NORMAL. Would like for him to continue the good work. Do not need to make any adjustments to his treatment plan at this time. We can recheck in a year or so.  Algis Greenhouse. Jerline Pain, MD 11/19/2018 10:59 AM

## 2018-12-18 ENCOUNTER — Ambulatory Visit (INDEPENDENT_AMBULATORY_CARE_PROVIDER_SITE_OTHER): Payer: Medicare HMO | Admitting: Family Medicine

## 2018-12-18 DIAGNOSIS — M545 Low back pain, unspecified: Secondary | ICD-10-CM

## 2018-12-18 DIAGNOSIS — R200 Anesthesia of skin: Secondary | ICD-10-CM

## 2018-12-18 DIAGNOSIS — M25551 Pain in right hip: Secondary | ICD-10-CM | POA: Diagnosis not present

## 2018-12-18 MED ORDER — DICLOFENAC SODIUM 75 MG PO TBEC: 75.0000 mg | DELAYED_RELEASE_TABLET | Freq: Two times a day (BID) | ORAL | 0 refills | Status: DC
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
## 2018-12-24 DIAGNOSIS — Z0001 Encounter for general adult medical examination with abnormal findings: Secondary | ICD-10-CM | POA: Diagnosis not present

## 2018-12-24 DIAGNOSIS — Z1211 Encounter for screening for malignant neoplasm of colon: Secondary | ICD-10-CM | POA: Diagnosis not present

## 2018-12-29 ENCOUNTER — Other Ambulatory Visit: Payer: Self-pay | Admitting: Family Medicine

## 2018-12-30 ENCOUNTER — Encounter: Payer: Self-pay | Admitting: Orthopaedic Surgery

## 2018-12-30 ENCOUNTER — Ambulatory Visit (INDEPENDENT_AMBULATORY_CARE_PROVIDER_SITE_OTHER): Payer: Medicare HMO

## 2018-12-30 ENCOUNTER — Ambulatory Visit (INDEPENDENT_AMBULATORY_CARE_PROVIDER_SITE_OTHER): Payer: Medicare HMO | Admitting: Orthopaedic Surgery

## 2018-12-30 DIAGNOSIS — M545 Low back pain, unspecified: Secondary | ICD-10-CM

## 2018-12-30 DIAGNOSIS — G8929 Other chronic pain: Secondary | ICD-10-CM | POA: Diagnosis not present

## 2018-12-30 LAB — COLOGUARD: Cologuard: POSITIVE — AB

## 2018-12-30 NOTE — Progress Notes (Signed)
Office Visit Note   Patient: Wesley Black           Date of Birth: August 02, 1950           MRN: [id number] Visit Date: 12/30/2018              Requested by: Vivi Barrack, MD [address] [place],  [place] [address] PCP: Vivi Barrack, MD   Assessment & Plan: Visit Diagnoses:  1. Chronic low back pain, unspecified back pain laterality, unspecified whether sciatica present     Plan: Impression is lumbar radiculopathy.  Fortunately he does not have any weakness or pain at this time.  I have recommended that he continue to take diclofenac as needed but also try to wean this over the next several weeks.  He is very interested in outpatient PT for core strengthening and just overall strengthening to help his cycling.  He will call if he needs a refill for diclofenac.  If his pain or weakness returns we may need to consider an MRI.  Follow-Up Instructions: Return if symptoms worsen or fail to improve.   Orders:  Orders Placed This Encounter  Procedures  . XR Lumbar Spine 2-3 Views   No orders of the defined types were placed in this encounter.     Procedures: No procedures performed   Clinical Data: No additional findings.   Subjective: Chief Complaint  Patient presents with  . Lower Back - Pain    Wesley Black is a very pleasant 68 year old gentleman who comes in for right lower extremity numbness that originally started with pain and weakness after he kept his leg propped up on a desk for 2 hours.  He is right-handed cyclist and is in extremely good shape.  He states that his pain has resolved as well as his weakness ever since his PCP put him on diclofenac which she is taking twice a day.  He has been taking this for the last 10 days or so.  Denies any red flag symptoms.   Review of Systems  Constitutional: Negative.   All other systems reviewed and are negative.    Objective: Vital Signs: There were no vitals taken for this visit.  Physical Exam Vitals signs and  nursing note reviewed.  Constitutional:      Appearance: He is well-developed.  HENT:     Head: Normocephalic and atraumatic.  Eyes:     Pupils: Pupils are equal, round, and reactive to light.  Neck:     Musculoskeletal: Neck supple.  Pulmonary:     Effort: Pulmonary effort is normal.  Abdominal:     Palpations: Abdomen is soft.  Musculoskeletal: Normal range of motion.  Skin:    General: Skin is warm.  Neurological:     Mental Status: He is alert and oriented to person, place, and time.  Psychiatric:        Behavior: Behavior normal.        Thought Content: Thought content normal.        Judgment: Judgment normal.     Ortho Exam Right lower extremity exam shows no focal motor deficits.  Normal reflexes.  No sciatic tension signs.  He has decreased sensation to the lateral calf and posterior calf. Specialty Comments:  No specialty comments available.  Imaging: Xr Lumbar Spine 2-3 Views  Result Date: 12/30/2018 Scoliosis of the thoracolumbar section.  No significant degenerative changes.    PMFS History: Patient Active Problem List   Diagnosis Date Noted  .  Cough 04/14/2018  . Skin nodule 04/14/2018  . 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 Allergy   . Fracture    Multiple    Family History  Problem Relation Age of Onset  . Hypertension Mother   . Arthritis Mother   . Hyperlipidemia Mother   . Prostate cancer Father   . Cancer Father   . Hyperlipidemia Father   . Hypertension Father   . Prostate cancer Brother   . Obesity Brother   . Asthma Maternal Grandmother   . Hyperlipidemia Maternal Grandfather   . Hypertension Maternal Grandfather   . Stroke Maternal Grandfather   . COPD Paternal Grandfather     Past Surgical History:  Procedure Laterality Date  . KNEE ARTHROPLASTY Right 2004  . LASIK Bilateral 2001  . SHOULDER SURGERY Left 1984   Social History   Occupational  History  . Occupation: Retired  Tobacco Use  . Smoking status: Never Smoker  . Smokeless tobacco: Never Used  Substance and Sexual Activity  . Alcohol use: Yes    Comment: 2 glasses of wine or beer per month  . Drug use: No  . Sexual activity: Not on file    Comment: Married

## 2018-12-31 ENCOUNTER — Telehealth: Payer: Self-pay | Admitting: Family Medicine

## 2018-12-31 NOTE — Telephone Encounter (Signed)
[facility] lab called stating patient did have an abnormal covid test.  The lab faxed over the results yesterday 9/15 and will fax over again today. [phone]

## 2018-12-31 NOTE — Telephone Encounter (Signed)
See note

## 2019-01-01 NOTE — Telephone Encounter (Signed)
Patient did not have a COVID test.  Patient had a positive COLOGUARD test.  We have received the results and will contact the patient.

## 2019-01-02 DIAGNOSIS — R69 Illness, unspecified: Secondary | ICD-10-CM | POA: Diagnosis not present

## 2019-01-12 DIAGNOSIS — M79604 Pain in right leg: Secondary | ICD-10-CM | POA: Diagnosis not present

## 2019-01-12 DIAGNOSIS — M545 Low back pain: Secondary | ICD-10-CM | POA: Diagnosis not present

## 2019-01-12 DIAGNOSIS — M419 Scoliosis, unspecified: Secondary | ICD-10-CM | POA: Diagnosis not present

## 2019-01-12 DIAGNOSIS — M5416 Radiculopathy, lumbar region: Secondary | ICD-10-CM | POA: Diagnosis not present

## 2019-01-19 DIAGNOSIS — M79604 Pain in right leg: Secondary | ICD-10-CM | POA: Diagnosis not present

## 2019-01-19 DIAGNOSIS — M5416 Radiculopathy, lumbar region: Secondary | ICD-10-CM | POA: Diagnosis not present

## 2019-01-19 DIAGNOSIS — M419 Scoliosis, unspecified: Secondary | ICD-10-CM | POA: Diagnosis not present

## 2019-01-19 DIAGNOSIS — M545 Low back pain: Secondary | ICD-10-CM | POA: Diagnosis not present

## 2019-01-20 ENCOUNTER — Ambulatory Visit: Payer: Medicare HMO | Admitting: Family Medicine

## 2019-01-22 DIAGNOSIS — M5416 Radiculopathy, lumbar region: Secondary | ICD-10-CM | POA: Diagnosis not present

## 2019-01-22 DIAGNOSIS — M79604 Pain in right leg: Secondary | ICD-10-CM | POA: Diagnosis not present

## 2019-01-22 DIAGNOSIS — M419 Scoliosis, unspecified: Secondary | ICD-10-CM | POA: Diagnosis not present

## 2019-01-22 DIAGNOSIS — M545 Low back pain: Secondary | ICD-10-CM | POA: Diagnosis not present

## 2019-02-12 ENCOUNTER — Other Ambulatory Visit: Payer: Self-pay

## 2019-02-12 DIAGNOSIS — R195 Other fecal abnormalities: Secondary | ICD-10-CM

## 2019-02-13 ENCOUNTER — Telehealth: Payer: Self-pay | Admitting: Family Medicine

## 2019-02-13 NOTE — Telephone Encounter (Signed)
Pt stopped by to speak with Amber about a conversation they had about his positive Cologuard test.  He had a question for her - she had a recommendation and he wanted to follow up on that and talk about another idea he had.  He is in no rush - he said if she needs to call him back on Monday, that will be fine.

## 2019-02-17 ENCOUNTER — Other Ambulatory Visit (INDEPENDENT_AMBULATORY_CARE_PROVIDER_SITE_OTHER): Payer: Medicare HMO

## 2019-02-17 ENCOUNTER — Other Ambulatory Visit: Payer: Self-pay

## 2019-02-17 ENCOUNTER — Telehealth: Payer: Self-pay

## 2019-02-17 DIAGNOSIS — K921 Melena: Secondary | ICD-10-CM

## 2019-02-17 NOTE — Telephone Encounter (Signed)
Patient requesting a FOBT testing due to positive cologuard. Please advise

## 2019-02-17 NOTE — Telephone Encounter (Signed)
I still recommend colonoscopy. Will place order for FOBT.  Algis Greenhouse. Jerline Pain, MD 02/17/2019 2:31 PM

## 2019-02-17 NOTE — Telephone Encounter (Signed)
Patient notified,voices understanding.

## 2019-02-18 NOTE — Telephone Encounter (Signed)
Patient spoke with [facility] regarding this.

## 2019-02-19 ENCOUNTER — Other Ambulatory Visit: Payer: Medicare HMO

## 2019-02-19 LAB — FECAL OCCULT BLOOD, IMMUNOCHEMICAL: Fecal Occult Bld: NEGATIVE

## 2019-02-19 NOTE — Addendum Note (Signed)
Addended by: Francis Dowse T on: 02/19/2019 09:05 AM   Modules accepted: Orders

## 2019-02-19 NOTE — Addendum Note (Signed)
Addended by: WARREN-COBBS, Mylan Lengyel T on: 02/19/2019 09:05 AM   Modules accepted: Orders  

## 2019-02-20 NOTE — Progress Notes (Signed)
Please inform patient of the following:  FOBT test was negative - no signs of blood in stool. While it is likely that his cologuard was a false positive, we cannot say that definitively and I still recommend getting a colonoscopy or at least talking to the GI specialist.  Wesley Black. Jerline Pain, MD 02/20/2019 3:15 PM

## 2019-02-23 ENCOUNTER — Other Ambulatory Visit: Payer: Self-pay

## 2019-02-23 DIAGNOSIS — R195 Other fecal abnormalities: Secondary | ICD-10-CM

## 2019-02-23 DIAGNOSIS — K921 Melena: Secondary | ICD-10-CM

## 2019-02-24 ENCOUNTER — Encounter: Payer: Self-pay | Admitting: Physician Assistant

## 2019-03-04 ENCOUNTER — Other Ambulatory Visit: Payer: Self-pay

## 2019-03-04 ENCOUNTER — Encounter: Payer: Self-pay | Admitting: Physician Assistant

## 2019-03-04 ENCOUNTER — Ambulatory Visit: Payer: Medicare HMO | Admitting: Physician Assistant

## 2019-03-04 VITALS — BP 126/78 | HR 60 | Temp 97.2°F | Ht 75.0 in | Wt 201.0 lb

## 2019-03-04 DIAGNOSIS — R195 Other fecal abnormalities: Secondary | ICD-10-CM | POA: Diagnosis not present

## 2019-03-04 DIAGNOSIS — Z1159 Encounter for screening for other viral diseases: Secondary | ICD-10-CM

## 2019-03-04 MED ORDER — NA SULFATE-K SULFATE-MG SULF 17.5-3.13-1.6 GM/177ML PO SOLN: 1.0000 | Freq: Once | ORAL | 0 refills | Status: AC

## 2019-03-04 NOTE — Progress Notes (Signed)
Chief Complaint: Positive Cologuard  HPI:    Wesley Black is a 68 year old Caucasian male with a past medical history as listed below, known to Dr. Fuller Plan, who was referred to me by Vivi Barrack, MD for a complaint of positive Cologuard.      10/9/2 colonoscopy for positive fecal occult blood test with Dr. Fuller Plan was normal.    12/30/2018 Cologuard was positive.  02/17/2019 fecal occult blood test was negative.    Today, the patient presents clinic and has a long discussion about his positive Cologuard test.  Explains that his last colonoscopy was in 2002 and since then he has been having Hemoccults yearly which have been negative, and changed to a Cologuard this year which turned out positive.  Tells me he talked with some friends and they told him to recheck results with "another test", so he requested Hemoccult which was then negative.  Tells me he discussed this with the doctor friend of his who explained that the Cologuard is more sensitive and he recommended he have a colonoscopy.  Patient denies all GI complaints.    Patient does tell me he has a history of phlebitis after a knee surgery for torn meniscus a couple of years ago.  This risk he did make him slightly worried.  We discussed this in detail that this is just from the IV site and less likely, given that he has had multiple other blood draws etc. and had no problem in the past.    [employer] member.    Denies fever, chills, weight loss, change in bowel habits, abdominal pain or seeing bright red blood in his stool or melena.  Past Medical History:  Diagnosis Date  . Allergy   . Fracture    Multiple    Past Surgical History:  Procedure Laterality Date  . KNEE ARTHROPLASTY Right 2004  . LASIK Bilateral 2001  . SHOULDER SURGERY Left 1984    Current Outpatient Medications  Medication Sig Dispense Refill  . azelastine (ASTELIN) 0.1 % nasal spray Place 2 sprays into both nostrils 2 (two) times daily. Use in each  nostril as directed 30 mL 12  . b complex vitamins tablet Take 1 tablet by mouth daily.    . budesonide-formoterol (SYMBICORT) 80-4.5 MCG/ACT inhaler Take 2 puffs first thing in am and then another 2 puffs about 12 hours later. 1 Inhaler 12  . diclofenac (VOLTAREN) 75 MG EC tablet TAKE 1 TABLET BY MOUTH TWICE A DAY 30 tablet 0  . Multiple Vitamin (MULTIVITAMIN) capsule Take 1 capsule by mouth daily.     No current facility-administered medications for this visit.     Allergies as of 03/04/2019  . (No Known Allergies)    Family History  Problem Relation Age of Onset  . Hypertension Mother   . Arthritis Mother   . Hyperlipidemia Mother   . Prostate cancer Father   . Cancer Father   . Hyperlipidemia Father   . Hypertension Father   . Prostate cancer Brother   . Obesity Brother   . Asthma Maternal Grandmother   . Hyperlipidemia Maternal Grandfather   . Hypertension Maternal Grandfather   . Stroke Maternal Grandfather   . COPD Paternal Grandfather     Social History   Socioeconomic History  . Marital status: Married    Spouse name: Not on file  . Number of children: 0  . Years of education: Master's  . Highest education level: Not on file  Occupational  History  . Occupation: Retired  Engineer, productionocial Needs  . Financial resource strain: Not on file  . Food insecurity    Worry: Not on file    Inability: Not on file  . Transportation needs    Medical: Not on file    Non-medical: Not on file  Tobacco Use  . Smoking status: Never Smoker  . Smokeless tobacco: Never Used  Substance and Sexual Activity  . Alcohol use: Yes    Comment: 2 glasses of wine or beer per month  . Drug use: No  . Sexual activity: Not on file    Comment: Married  Lifestyle  . Physical activity    Days per week: Not on file    Minutes per session: Not on file  . Stress: Not on file  Relationships  . Social Musicianconnections    Talks on phone: Not on file    Gets together: Not on file    Attends religious  service: Not on file    Active member of club or organization: Not on file    Attends meetings of clubs or organizations: Not on file    Relationship status: Not on file  . Intimate partner violence    Fear of current or ex partner: Not on file    Emotionally abused: Not on file    Physically abused: Not on file    Forced sexual activity: Not on file  Other Topics Concern  . Not on file  Social History Narrative   Lives at home, separated   Left-handed   Caffeine: 1 glass of tea per day, occasional 7 oz Pepsi (every 2 wks)       Review of Systems:    Constitutional: No weight loss, fever or chills Skin: No rash  Cardiovascular: No chest pain Respiratory: No SOB Gastrointestinal: See HPI and otherwise negative Genitourinary: No dysuria  Neurological: No headache, dizziness or syncope Musculoskeletal: No new muscle or joint pain Hematologic: No bleeding  Psychiatric: No history of depression or anxiety   Physical Exam:  Vital signs: BP 126/78   Pulse 60   Temp (!) 97.2 F (36.2 C)   Ht 6\' 3"  (1.905 m)   Wt 201 lb (91.2 kg)   BMI 25.12 kg/m   Constitutional:   Pleasant Caucasian male appears to be in NAD, Well developed, Well nourished, alert and cooperative Head:  Normocephalic and atraumatic. Eyes:   PEERL, EOMI. No icterus. Conjunctiva pink. Ears:  Normal auditory acuity. Neck:  Supple Throat: Oral cavity and pharynx without inflammation, swelling or lesion.  Respiratory: Respirations even and unlabored. Lungs clear to auscultation bilaterally.   No wheezes, crackles, or rhonchi.  Cardiovascular: Normal S1, S2. No MRG. Regular rate and rhythm. No peripheral edema, cyanosis or pallor.  Gastrointestinal:  Soft, nondistended, nontender. No rebound or guarding. Normal bowel sounds. No appreciable masses or hepatomegaly. Rectal:  Not performed.  Msk:  Symmetrical without gross deformities. Without edema, no deformity or joint abnormality.  Neurologic:  Alert and   oriented x4;  grossly normal neurologically.  Skin:   Dry and intact without significant lesions or rashes. Psychiatric: Demonstrates good judgement and reason without abnormal affect or behaviors.  RELEVANT LABS AND IMAGING: CBC    Component Value Date/Time   WBC 4.1 11/18/2018 0849   RBC 4.66 11/18/2018 0849   HGB 14.7 11/18/2018 0849   HCT 42.7 11/18/2018 0849   PLT 135.0 (L) 11/18/2018 0849   MCV 91.6 11/18/2018 0849   MCHC 34.4 11/18/2018 0849  RDW 13.8 11/18/2018 0849   LYMPHSABS 1.9 11/13/2017 1558   MONOABS 0.4 11/13/2017 1558   EOSABS 0.1 11/13/2017 1558   BASOSABS 0.0 11/13/2017 1558    CMP     Component Value Date/Time   NA 138 11/18/2018 0849   NA 140 05/09/2017   K 4.4 11/18/2018 0849   CL 102 11/18/2018 0849   CO2 30 11/18/2018 0849   GLUCOSE 88 11/18/2018 0849   BUN 20 11/18/2018 0849   BUN 20 05/09/2017   CREATININE 1.05 11/18/2018 0849   CALCIUM 9.3 11/18/2018 0849   PROT 6.7 11/18/2018 0849   ALBUMIN 4.3 11/18/2018 0849   AST 20 11/18/2018 0849   ALT 12 11/18/2018 0849   ALKPHOS 58 11/18/2018 0849   BILITOT 0.7 11/18/2018 0849    Assessment: 1.  Positive Cologuard: 12/30/2018, last colonoscopy in 2002 was negative for polyps  Plan: 1.  Scheduled patient for a colonoscopy in the [facility] with Dr. Russella Dar.  Did discuss risks, benefits, limitations and alternatives in detail with the patient today and he agreed to proceed.  Patient will be Covid tested prior to time of exam. 2.  Patient to follow in clinic per recommendations from Dr. Russella Dar after time of procedure.  Hyacinth Meeker, PA-C [facility] 03/04/2019, 2:45 PM  Cc: Ardith Dark, MD

## 2019-03-04 NOTE — Patient Instructions (Signed)
If you are age 66 or older, your body mass index should be between 23-30. Your Body mass index is 25.12 kg/m. If this is out of the aforementioned range listed, please consider follow up with your Primary Care Provider.  If you are age 3 or younger, your body mass index should be between 19-25. Your Body mass index is 25.12 kg/m. If this is out of the aformentioned range listed, please consider follow up with your Primary Care Provider.   You have been scheduled for a colonoscopy. Please follow written instructions given to you at your visit today.  Please pick up your prep supplies at the pharmacy within the next 1-3 days. If you use inhalers (even only as needed), please bring them with you on the day of your procedure.

## 2019-03-05 ENCOUNTER — Other Ambulatory Visit: Payer: Self-pay

## 2019-03-05 NOTE — Progress Notes (Signed)
Reviewed and agree with management plan.  Salle Brandle T. Jeric Slagel, MD FACG [facility]  

## 2019-03-06 ENCOUNTER — Ambulatory Visit (INDEPENDENT_AMBULATORY_CARE_PROVIDER_SITE_OTHER): Payer: Medicare HMO

## 2019-03-06 ENCOUNTER — Encounter: Payer: Self-pay | Admitting: Family Medicine

## 2019-03-06 DIAGNOSIS — Z0000 Encounter for general adult medical examination without abnormal findings: Secondary | ICD-10-CM | POA: Diagnosis not present

## 2019-03-06 DIAGNOSIS — Z23 Encounter for immunization: Secondary | ICD-10-CM | POA: Diagnosis not present

## 2019-03-06 NOTE — Progress Notes (Signed)
I have personally reviewed the Medicare Annual Wellness Visit and agree with the assessment and plan.  Algis Greenhouse. Jerline Pain, MD 03/06/2019 3:00 PM

## 2019-03-06 NOTE — Progress Notes (Signed)
Subjective:   Wesley Black is a 68 y.o. male who presents for an Initial Medicare Annual Wellness Visit.  This visit occurred during the SARS-COVID-2 public health emergency. Safety protocols were in place, including screening questions prior to the visit, additional usage of staff PPE, and extensive cleaning of exam room while observing appropriate contact time as indicated for disinfecting solutions.   Review of Systems  Cardiac Risk Factors include: advanced age (>3055men, 35>65 women);male gender    Objective:    Today's Vitals   03/06/19 1350  BP: 124/72  Temp: 98.7 F (37.1 C)  TempSrc: Temporal  Weight: 202 lb (91.6 kg)  Height: 6\' 3"  (1.905 m)   Body mass index is 25.25 kg/m.  Advanced Directives 03/06/2019  Does Patient Have a Medical Advance Directive? Yes  Instructed patient to bring a copy to office for chart.   Current Medications (verified) Outpatient Encounter Medications as of 03/06/2019  Medication Sig  . b complex vitamins tablet Take 1 tablet by mouth daily.   No facility-administered encounter medications on file as of 03/06/2019.     Allergies (verified) Patient has no known allergies.   History: Past Medical History:  Diagnosis Date  . Allergy   . Fracture    Multiple   Past Surgical History:  Procedure Laterality Date  . KNEE ARTHROPLASTY Right 2004  . LASIK Bilateral 2001  . SHOULDER SURGERY Left 1984   Family History  Problem Relation Age of Onset  . Hypertension Mother   . Arthritis Mother   . Hyperlipidemia Mother   . Prostate cancer Father   . Cancer Father   . Hyperlipidemia Father   . Hypertension Father   . Prostate cancer Brother   . Obesity Brother   . Asthma Maternal Grandmother   . Hyperlipidemia Maternal Grandfather   . Hypertension Maternal Grandfather   . Stroke Maternal Grandfather   . COPD Paternal Grandfather    Social History   Socioeconomic History  . Marital status: Married    Spouse name: Not on  file  . Number of children: 0  . Years of education: Master's  . Highest education level: Not on file  Occupational History  . Occupation: Retired  Engineer, productionocial Needs  . Financial resource strain: Not on file  . Food insecurity    Worry: Not on file    Inability: Not on file  . Transportation needs    Medical: Not on file    Non-medical: Not on file  Tobacco Use  . Smoking status: Never Smoker  . Smokeless tobacco: Never Used  Substance and Sexual Activity  . Alcohol use: Yes    Comment: 2 glasses of wine or beer per month  . Drug use: No  . Sexual activity: Not on file    Comment: Married  Lifestyle  . Physical activity    Days per week: Not on file    Minutes per session: Not on file  . Stress: Not on file  Relationships  . Social Musicianconnections    Talks on phone: Not on file    Gets together: Not on file    Attends religious service: Not on file    Active member of club or organization: Not on file    Attends meetings of clubs or organizations: Not on file    Relationship status: Not on file  Other Topics Concern  . Not on file  Social History Narrative   Lives at home, separated   Left-handed   Caffeine:  1 glass of tea per day, occasional 7 oz Pepsi (every 2 wks)      Tobacco Counseling Counseling given: Not Answered  Clinical Intake:  Pre-visit preparation completed: Yes  Pain : No/denies pain   Diabetes: No  How often do you need to have someone help you when you read instructions, pamphlets, or other written materials from your doctor or pharmacy?: 1 - Never  Interpreter Needed?: No  Information entered by :: Nathaniel Man LPN  Activities of Daily Living In your present state of health, do you have any difficulty performing the following activities: 03/06/2019  Hearing? N  Vision? N  Difficulty concentrating or making decisions? N  Walking or climbing stairs? N  Dressing or bathing? N  Doing errands, shopping? N  Preparing Food and eating ? N   Using the Toilet? N  In the past six months, have you accidently leaked urine? N  Do you have problems with loss of bowel control? N  Managing your Medications? N  Managing your Finances? N  Housekeeping or managing your Housekeeping? N  Some recent data might be hidden     Immunizations and Health Maintenance Immunization History  Administered Date(s) Administered  . Fluad Quad(high Dose 65+) 03/06/2019  . Influenza,inj,Quad PF,6+ Mos 12/12/2017  . Pneumococcal Conjugate-13 12/12/2017  . Pneumococcal Polysaccharide-23 03/06/2019  . Tdap 11/18/2018   Health Maintenance Due  Topic Date Due  . INFLUENZA VACCINE  11/15/2018  . PNA vac Low Risk Adult (2 of 2 - PPSV23) 12/13/2018    Patient Care Team: Ardith Dark, MD as PCP - General (Family Medicine) Jethro Bastos, MD as Consulting Physician (Family Medicine) Etta Quill, MD as Attending Physician (Ophthalmology) Etta Quill, MD as Attending Physician (Ophthalmology)  Indicate any recent Medical Services you may have received from other than [facility] providers in the past year (date may be approximate).    Assessment:   This is a routine wellness examination for Wesley Black.  Hearing/Vision screen No exam data present  Dietary issues and exercise activities discussed: Current Exercise Habits: Home exercise routine, Type of exercise: walking;strength training/weights;Other - see comments(mountain biking), Exercise limited by: Other - see comments(COVID-19)  Goals Encourage intake of 6-8 8oz glasses of water each day, consume diet rich in fresh fruits and vegetables, maintain current health.  Depression Screen PHQ 2/9 Scores 03/06/2019 11/18/2018 04/14/2018  PHQ - 2 Score 0 1 0  PHQ- 9 Score - 1 -    Fall Risk Fall Risk  03/06/2019  Falls in the past year? 0  Injury with Fall? 0  Follow up Education provided;Falls prevention discussed;Falls evaluation completed    Is the patient's home free of loose throw  rugs in walkways, pet beds, electrical cords, etc?   yes      Grab bars in the bathroom? yes      Handrails on the stairs?   yes      Adequate lighting?   yes  Timed Get Up and Go performed: completed and within normal timeframe; no gait abnormalities noted   Cognitive Function: no cognitive concerns today Cognitive Testing  Alert? Yes         Normal Appearance? Yes Oriented to person? Yes           Place? Yes  Time? Yes  Recall of three objects? Yes  Can perform simple calculations? Yes  Displays appropriate judgment? Yes  Can read the correct time from a watch face? Yes     Screening Tests  Health Maintenance  Topic Date Due  . INFLUENZA VACCINE  11/15/2018  . PNA vac Low Risk Adult (2 of 2 - PPSV23) 12/13/2018  . Fecal DNA (Cologuard)  12/29/2021  . TETANUS/TDAP  11/17/2028  . Hepatitis C Screening  Completed    Qualifies for Shingles Vaccine? Patient would like to obtain varicella titer then obtain Shingrix if eligible.  Cancer Screenings: Lung: Low Dose CT Chest recommended if Age 15-80 years, 30 pack-year currently smoking OR have quit w/in 15years. Patient does not qualify. Colorectal: colonoscopy scheduled 03/24/2019    Plan:    I have personally reviewed and noted the following in the patient's chart:   . Medical and social history . Use of alcohol, tobacco or illicit drugs  . Current medications and supplements . Functional ability and status . Nutritional status . Physical activity . Advanced directives . List of other physicians . Hospitalizations, surgeries, and ER visits in previous 12 months . Vitals . Screenings to include cognitive, depression, and falls . Referrals and appointments  In addition, I have reviewed and discussed with patient certain preventive protocols, quality metrics, and best practice recommendations. A written personalized care plan for preventive services as well as general preventive health recommendations were provided to  patient.     Franne Forts, LPN   10/10/9483

## 2019-03-06 NOTE — Patient Instructions (Addendum)
Wesley Black , Thank you for taking time to come for your Medicare Wellness Visit. I appreciate your ongoing commitment to your health goals. Please review the following plan we discussed and let me know if I can assist you in the future.   Screening recommendations/referrals: Colorectal Screening: colonoscopy is already scheduled 03/24/2019  Vision and Dental Exams: Recommended annual ophthalmology exams for early detection of glaucoma and other disorders of the eye Recommended annual dental exams for proper oral hygiene  Diabetic Exams: Diabetic Eye Exam: N/A Diabetic Foot Exam: N/A   Vaccinations: Influenza vaccine: received today Pneumococcal vaccine: received pneumovax 23 today Tdap vaccine: up to date; completed 11/18/2018 Shingles vaccine: Please check with your local pharmacy about cost and obtain there. Advanced directives: Advance directives discussed with you today. Please bring a copy to the office for [place] to place in your chart.  Goals: Recommend to drink at least 6-8 8oz glasses of water per day. Consume a diet rich in fresh fruit and vegetables. Maintain current state of health.   Next appointment: Please schedule your Annual Wellness Visit with your Nurse Health Advisor in one year.  Preventive Care 20 Years and Older, Male Preventive care refers to lifestyle choices and visits with your health care provider that can promote health and wellness. What does preventive care include?  A yearly physical exam. This is also called an annual well check.  Dental exams once or twice a year.  Routine eye exams. Ask your health care provider how often you should have your eyes checked.  Personal lifestyle choices, including:  Daily care of your teeth and gums.  Regular physical activity.  Eating a healthy diet.  Avoiding tobacco and drug use.  Limiting alcohol use.  Practicing safe sex.  Taking low doses of aspirin every day if recommended by your health care provider..   Taking vitamin and mineral supplements as recommended by your health care provider. What happens during an annual well check? The services and screenings done by your health care provider during your annual well check will depend on your age, overall health, lifestyle risk factors, and family history of disease. Counseling  Your health care provider may ask you questions about your:  Alcohol use.  Tobacco use.  Drug use.  Emotional well-being.  Home and relationship well-being.  Sexual activity.  Eating habits.  History of falls.  Memory and ability to understand (cognition).  Work and work Statistician. Screening  You may have the following tests or measurements:  Height, weight, and BMI.  Blood pressure.  Lipid and cholesterol levels. These may be checked every 5 years, or more frequently if you are over 31 years old.  Skin check.  Lung cancer screening. You may have this screening every year starting at age 10 if you have a 30-pack-year history of smoking and currently smoke or have quit within the past 15 years.  Fecal occult blood test (FOBT) of the stool. You may have this test every year starting at age 81.  Flexible sigmoidoscopy or colonoscopy. You may have a sigmoidoscopy every 5 years or a colonoscopy every 10 years starting at age 8.  Prostate cancer screening. Recommendations will vary depending on your family history and other risks.  Hepatitis C blood test.  Hepatitis B blood test.  Sexually transmitted disease (STD) testing.  Diabetes screening. This is done by checking your blood sugar (glucose) after you have not eaten for a while (fasting). You may have this done every 1-3 years.  Abdominal aortic  aneurysm (AAA) screening. You may need this if you are a current or former smoker.  Osteoporosis. You may be screened starting at age 66 if you are at high risk. Talk with your health care provider about your test results, treatment options, and  if necessary, the need for more tests. Vaccines  Your health care provider may recommend certain vaccines, such as:  Influenza vaccine. This is recommended every year.  Tetanus, diphtheria, and acellular pertussis (Tdap, Td) vaccine. You may need a Td booster every 10 years.  Zoster vaccine. You may need this after age 66.  Pneumococcal 13-valent conjugate (PCV13) vaccine. One dose is recommended after age 66.  Pneumococcal polysaccharide (PPSV23) vaccine. One dose is recommended after age 165. Talk to your health care provider about which screenings and vaccines you need and how often you need them. This information is not intended to replace advice given to you by your health care provider. Make sure you discuss any questions you have with your health care provider. Document Released: 04/29/2015 Document Revised: 12/21/2015 Document Reviewed: 02/01/2015 Elsevier Interactive Patient Education  2017 [employer]  Fall Prevention in the Home Falls can cause injuries. They can happen to people of all ages. There are many things you can do to make your home safe and to help prevent falls. What can I do on the outside of my home?  Regularly fix the edges of walkways and driveways and fix any cracks.  Remove anything that might make you trip as you walk through a door, such as a raised step or threshold.  Trim any bushes or trees on the path to your home.  Use bright outdoor lighting.  Clear any walking paths of anything that might make someone trip, such as rocks or tools.  Regularly check to see if handrails are loose or broken. Make sure that both sides of any steps have handrails.  Any raised decks and porches should have guardrails on the edges.  Have any leaves, snow, or ice cleared regularly.  Use sand or salt on walking paths during winter.  Clean up any spills in your garage right away. This includes oil or grease spills. What can I do in the bathroom?  Use night  lights.  Install grab bars by the toilet and in the tub and shower. Do not use towel bars as grab bars.  Use non-skid mats or decals in the tub or shower.  If you need to sit down in the shower, use a plastic, non-slip stool.  Keep the floor dry. Clean up any water that spills on the floor as soon as it happens.  Remove soap buildup in the tub or shower regularly.  Attach bath mats securely with double-sided non-slip rug tape.  Do not have throw rugs and other things on the floor that can make you trip. What can I do in the bedroom?  Use night lights.  Make sure that you have a light by your bed that is easy to reach.  Do not use any sheets or blankets that are too big for your bed. They should not hang down onto the floor.  Have a firm chair that has side arms. You can use this for support while you get dressed.  Do not have throw rugs and other things on the floor that can make you trip. What can I do in the kitchen?  Clean up any spills right away.  Avoid walking on wet floors.  Keep items that you use a lot  in easy-to-reach places.  If you need to reach something above you, use a strong step stool that has a grab bar.  Keep electrical cords out of the way.  Do not use floor polish or wax that makes floors slippery. If you must use wax, use non-skid floor wax.  Do not have throw rugs and other things on the floor that can make you trip. What can I do with my stairs?  Do not leave any items on the stairs.  Make sure that there are handrails on both sides of the stairs and use them. Fix handrails that are broken or loose. Make sure that handrails are as long as the stairways.  Check any carpeting to make sure that it is firmly attached to the stairs. Fix any carpet that is loose or worn.  Avoid having throw rugs at the top or bottom of the stairs. If you do have throw rugs, attach them to the floor with carpet tape.  Make sure that you have a light switch at the  top of the stairs and the bottom of the stairs. If you do not have them, ask someone to add them for you. What else can I do to help prevent falls?  Wear shoes that:  Do not have high heels.  Have rubber bottoms.  Are comfortable and fit you well.  Are closed at the toe. Do not wear sandals.  If you use a stepladder:  Make sure that it is fully opened. Do not climb a closed stepladder.  Make sure that both sides of the stepladder are locked into place.  Ask someone to hold it for you, if possible.  Clearly mark and make sure that you can see:  Any grab bars or handrails.  First and last steps.  Where the edge of each step is.  Use tools that help you move around (mobility aids) if they are needed. These include:  Canes.  Walkers.  Scooters.  Crutches.  Turn on the lights when you go into a dark area. Replace any light bulbs as soon as they burn out.  Set up your furniture so you have a clear path. Avoid moving your furniture around.  If any of your floors are uneven, fix them.  If there are any pets around you, be aware of where they are.  Review your medicines with your doctor. Some medicines can make you feel dizzy. This can increase your chance of falling. Ask your doctor what other things that you can do to help prevent falls. This information is not intended to replace advice given to you by your health care provider. Make sure you discuss any questions you have with your health care provider. Document Released: 01/27/2009 Document Revised: 09/08/2015 Document Reviewed: 05/07/2014 Elsevier Interactive Patient Education  2017 [employer].

## 2019-03-10 NOTE — Progress Notes (Signed)
Patient notified and voices understanding 

## 2019-03-17 ENCOUNTER — Encounter: Payer: Self-pay | Admitting: Gastroenterology

## 2019-03-20 ENCOUNTER — Other Ambulatory Visit: Payer: Self-pay | Admitting: Gastroenterology

## 2019-03-20 ENCOUNTER — Ambulatory Visit (INDEPENDENT_AMBULATORY_CARE_PROVIDER_SITE_OTHER): Payer: Medicare HMO

## 2019-03-20 DIAGNOSIS — Z1159 Encounter for screening for other viral diseases: Secondary | ICD-10-CM

## 2019-03-20 LAB — SARS CORONAVIRUS 2 (TAT 6-24 HRS): SARS Coronavirus 2: NEGATIVE

## 2019-03-24 ENCOUNTER — Other Ambulatory Visit: Payer: Self-pay

## 2019-03-24 ENCOUNTER — Encounter: Payer: Self-pay | Admitting: Gastroenterology

## 2019-03-24 ENCOUNTER — Ambulatory Visit (AMBULATORY_SURGERY_CENTER): Payer: Medicare HMO | Admitting: Gastroenterology

## 2019-03-24 VITALS — BP 123/75 | HR 63 | Temp 97.9°F | Resp 12 | Ht 75.0 in | Wt 201.0 lb

## 2019-03-24 DIAGNOSIS — K640 First degree hemorrhoids: Secondary | ICD-10-CM | POA: Diagnosis not present

## 2019-03-24 DIAGNOSIS — R195 Other fecal abnormalities: Secondary | ICD-10-CM

## 2019-03-24 MED ORDER — SODIUM CHLORIDE 0.9 % IV SOLN: 500.0000 mL | Freq: Once | INTRAVENOUS | Status: DC

## 2019-03-24 NOTE — Op Note (Addendum)
[facility] Patient Name: Wesley Black Procedure Date: 03/24/2019 9:23 AM MRN: [id number] Endoscopist: Meryl Dare , MD Age: 68 Referring MD:  Date of Birth: 1950/10/15 Gender: Male Account #: [id number] Procedure:                Colonoscopy Indications:              Positive Cologuard test Medicines:                Monitored Anesthesia Care Procedure:                Pre-Anesthesia Assessment:                           - Prior to the procedure, a History and Physical                            was performed, and patient medications and                            allergies were reviewed. The patient's tolerance of                            previous anesthesia was also reviewed. The risks                            and benefits of the procedure and the sedation                            options and risks were discussed with the patient.                            All questions were answered, and informed consent                            was obtained. Prior Anticoagulants: The patient has                            taken no previous anticoagulant or antiplatelet                            agents. ASA Grade Assessment: I - A normal, healthy                            patient. After reviewing the risks and benefits,                            the patient was deemed in satisfactory condition to                            undergo the procedure.                           After obtaining informed consent, the colonoscope  was passed under direct vision. Throughout the                            procedure, the patient's blood pressure, pulse, and                            oxygen saturations were monitored continuously. The                            Colonoscope was introduced through the anus and                            advanced to the the cecum, identified by                            appendiceal orifice and ileocecal valve. The             ileocecal valve, appendiceal orifice, and rectum                            were photographed. The quality of the bowel                            preparation was good. The colonoscopy was performed                            without difficulty. The patient tolerated the                            procedure well. Scope In: 9:29:58 AM Scope Out: 9:54:36 AM Scope Withdrawal Time: 0 hours 16 minutes 21 seconds  Total Procedure Duration: 0 hours 24 minutes 38 seconds  Findings:                 The perianal and digital rectal examinations were                            normal.                           Internal hemorrhoids were found during                            retroflexion. The hemorrhoids were small and Grade                            I (internal hemorrhoids that do not prolapse).                           The exam was otherwise without abnormality on                            direct and retroflexion views. Complications:            No immediate complications. Estimated blood loss:  None. Estimated Blood Loss:     Estimated blood loss: none. Impression:               - Internal hemorrhoids.                           - The examination was otherwise normal on direct                            and retroflexion views.                           - No specimens collected. Recommendation:           - Repeat colonoscopy in 10 years for screening                            purposes.                           - Patient has a contact number available for                            emergencies. The signs and symptoms of potential                            delayed complications were discussed with the                            patient. Return to normal activities tomorrow.                            Written discharge instructions were provided to the                            patient.                           - Resume previous diet.                            - Continue present medications. Meryl DareMalcolm T Caterina Racine, MD 03/24/2019 9:57:11 AM This report has been signed electronically.

## 2019-03-24 NOTE — Progress Notes (Signed)
PT taken to PACU. Monitors in place. VSS. Report given to RN. 

## 2019-03-24 NOTE — Progress Notes (Signed)
VS-Courtney [place] Temperature- June Bullock   

## 2019-03-24 NOTE — Patient Instructions (Addendum)
Thank you for allowing us to care for you today!  Resume previous diet and medications today.  Return to your normal activities tomorrow  Recommend next screening colonoscopy in 10 years.      YOU HAD AN ENDOSCOPIC PROCEDURE TODAY AT THE [facility]:   Refer to the procedure report that was given to you for any specific questions about what was found during the examination.  If the procedure report does not answer your questions, please call your gastroenterologist to clarify.  If you requested that your care partner not be given the details of your procedure findings, then the procedure report has been included in a sealed envelope for you to review at your convenience later.  YOU SHOULD EXPECT: Some feelings of bloating in the abdomen. Passage of more gas than usual.  Walking can help get rid of the air that was put into your GI tract during the procedure and reduce the bloating. If you had a lower endoscopy (such as a colonoscopy or flexible sigmoidoscopy) you may notice spotting of blood in your stool or on the toilet paper. If you underwent a bowel prep for your procedure, you may not have a normal bowel movement for a few days.  Please Note:  You might notice some irritation and congestion in your nose or some drainage.  This is from the oxygen used during your procedure.  There is no need for concern and it should clear up in a day or so.  SYMPTOMS TO REPORT IMMEDIATELY:   Following lower endoscopy (colonoscopy or flexible sigmoidoscopy):  Excessive amounts of blood in the stool  Significant tenderness or worsening of abdominal pains  Swelling of the abdomen that is new, acute  Fever of 100F or higher    For urgent or emergent issues, a gastroenterologist can be reached at any hour by calling [phone].   DIET:  We do recommend a small meal at first, but then you may proceed to your regular diet.  Drink plenty of fluids but you should avoid alcoholic  beverages for 24 hours.  ACTIVITY:  You should plan to take it easy for the rest of today and you should NOT DRIVE or use heavy machinery until tomorrow (because of the sedation medicines used during the test).    FOLLOW UP: Our staff will call the number listed on your records 48-72 hours following your procedure to check on you and address any questions or concerns that you may have regarding the information given to you following your procedure. If we do not reach you, we will leave a message.  We will attempt to reach you two times.  During this call, we will ask if you have developed any symptoms of COVID 19. If you develop any symptoms (ie: fever, flu-like symptoms, shortness of breath, cough etc.) before then, please call [phone].  If you test positive for Covid 19 in the 2 weeks post procedure, please call and report this information to us.    If any biopsies were taken you will be contacted by phone or by letter within the next 1-3 weeks.  Please call us at [phone] if you have not heard about the biopsies in 3 weeks.    SIGNATURES/CONFIDENTIALITY: You and/or your care partner have signed paperwork which will be entered into your electronic medical record.  These signatures attest to the fact that that the information above on your After Visit Summary has been reviewed and is understood.  Full responsibility of the   confidentiality of this discharge information lies with you and/or your care-partner. 

## 2019-03-26 ENCOUNTER — Telehealth: Payer: Self-pay

## 2019-03-26 NOTE — Telephone Encounter (Signed)
  Follow up Call-  Call back number 03/24/2019  Post procedure Call Back phone  # [phone]  Permission to leave phone message Yes  Some recent data might be hidden     Patient questions:  Do you have a fever, pain , or abdominal swelling? No. Pain Score  0 *  Have you tolerated food without any problems? Yes.    Have you been able to return to your normal activities? Yes.    Do you have any questions about your discharge instructions: Diet   No. Medications  No. Follow up visit  No.  Do you have questions or concerns about your Care? No.  Actions: * If pain score is 4 or above: No action needed, pain <4. 1. Have you developed a fever since your procedure? no  2.   Have you had an respiratory symptoms (SOB or cough) since your procedure? no  3.   Have you tested positive for COVID 19 since your procedure no  4.   Have you had any family members/close contacts diagnosed with the COVID 19 since your procedure?  no   If yes to any of these questions please route to Joylene John, RN and Alphonsa Gin, Therapist, sports.

## 2019-04-03 ENCOUNTER — Ambulatory Visit (INDEPENDENT_AMBULATORY_CARE_PROVIDER_SITE_OTHER): Payer: Medicare HMO | Admitting: Physician Assistant

## 2019-04-03 ENCOUNTER — Telehealth: Payer: Self-pay | Admitting: Family Medicine

## 2019-04-03 ENCOUNTER — Encounter: Payer: Self-pay | Admitting: Physician Assistant

## 2019-04-03 ENCOUNTER — Other Ambulatory Visit: Payer: Self-pay

## 2019-04-03 VITALS — BP 140/80 | HR 67 | Temp 97.8°F | Ht 75.0 in | Wt 201.0 lb

## 2019-04-03 DIAGNOSIS — H00011 Hordeolum externum right upper eyelid: Secondary | ICD-10-CM

## 2019-04-03 MED ORDER — ERYTHROMYCIN 5 MG/GM OP OINT: TOPICAL_OINTMENT | OPHTHALMIC | 0 refills | Status: DC

## 2019-04-03 NOTE — Progress Notes (Signed)
Wesley Black is a 68 y.o. male here for a new problem.  I acted as a Neurosurgeon for [employer], PA-C Corky Mull, LPN  History of Present Illness:   Chief Complaint  Patient presents with  . Eye Problem    HPI   Eye problem Pt c/o Rt eye lid red and swollen started on Tuesday. Also has swelling in cheek area. Slight itching. Has been using warm compresses on Rt eye and put a little Neosporin on lid last night. Denies crusting, fever, chills, malaise, blurred vision, double vision, LAD.  Past Medical History:  Diagnosis Date  . Fracture    Multiple     Social History   Socioeconomic History  . Marital status: Married    Spouse name: Not on file  . Number of children: 0  . Years of education: Master's  . Highest education level: Not on file  Occupational History  . Occupation: Retired  Tobacco Use  . Smoking status: Never Smoker  . Smokeless tobacco: Never Used  Substance and Sexual Activity  . Alcohol use: Yes    Comment: 2 glasses of wine or beer per month  . Drug use: No  . Sexual activity: Not on file    Comment: Married  Other Topics Concern  . Not on file  Social History Narrative   Lives at home, separated   Left-handed   Caffeine: 1 glass of tea per day, occasional 7 oz Pepsi (every 2 wks)      Social Determinants of Health   Financial Resource Strain:   . Difficulty of Paying Living Expenses: Not on file  Food Insecurity:   . Worried About Programme researcher, broadcasting/film/video in the Last Year: Not on file  . Ran Out of Food in the Last Year: Not on file  Transportation Needs:   . Lack of Transportation (Medical): Not on file  . Lack of Transportation (Non-Medical): Not on file  Physical Activity:   . Days of Exercise per Week: Not on file  . Minutes of Exercise per Session: Not on file  Stress:   . Feeling of Stress : Not on file  Social Connections:   . Frequency of Communication with Friends and Family: Not on file  . Frequency of Social Gatherings with  Friends and Family: Not on file  . Attends Religious Services: Not on file  . Active Member of Clubs or Organizations: Not on file  . Attends Banker Meetings: Not on file  . Marital Status: Not on file  Intimate Partner Violence:   . Fear of Current or Ex-Partner: Not on file  . Emotionally Abused: Not on file  . Physically Abused: Not on file  . Sexually Abused: Not on file    Past Surgical History:  Procedure Laterality Date  . COLONOSCOPY    . KNEE ARTHROPLASTY Right 2004  . LASIK Bilateral 2001  . SHOULDER SURGERY Left 1984    Family History  Problem Relation Age of Onset  . Hypertension Mother   . Arthritis Mother   . Hyperlipidemia Mother   . Prostate cancer Father   . Cancer Father   . Hyperlipidemia Father   . Hypertension Father   . Prostate cancer Brother   . Obesity Brother   . Asthma Maternal Grandmother   . Hyperlipidemia Maternal Grandfather   . Hypertension Maternal Grandfather   . Stroke Maternal Grandfather   . COPD Paternal Grandfather   . Colon cancer Neg Hx   . Esophageal  cancer Neg Hx   . Rectal cancer Neg Hx   . Stomach cancer Neg Hx     No Known Allergies  Current Medications:   Current Outpatient Medications:  .  b complex vitamins tablet, Take 1 tablet by mouth daily., Disp: , Rfl:  .  erythromycin ophthalmic ointment, Apply thin ribbon to affected eye BID x 3-5 days, Disp: 3.5 g, Rfl: 0   Review of Systems:   ROS  Negative unless otherwise specified per HPI.  Vitals:   Vitals:   04/03/19 1105  BP: 140/80  Pulse: 67  Temp: 97.8 F (36.6 C)  TempSrc: Temporal  SpO2: 98%  Weight: 201 lb (91.2 kg)  Height: 6\' 3"  (1.905 m)     Body mass index is 25.12 kg/m.  Physical Exam:   Physical Exam Vitals and nursing note reviewed.  Constitutional:      Appearance: He is well-developed.  HENT:     Head: Normocephalic.  Eyes:     General:        Right eye: Hordeolum present.     Conjunctiva/sclera: Conjunctivae  normal.     Pupils: Pupils are equal, round, and reactive to light.     Comments: Tiny hordeolum on R upper lid, approximately middle of lid; swelling and erythema to R upper lid; R outer conjunctiva with mild erythema; no active discharge in eye   Pulmonary:     Effort: Pulmonary effort is normal.  Musculoskeletal:        General: Normal range of motion.     Cervical back: Normal range of motion.  Skin:    General: Skin is warm and dry.  Neurological:     Mental Status: He is alert and oriented to person, place, and time.  Psychiatric:        Behavior: Behavior normal.        Thought Content: Thought content normal.        Judgment: Judgment normal.     Assessment and Plan:   Korver was seen today for eye problem.  Diagnoses and all orders for this visit:  Hordeolum externum of right upper eyelid  Other orders -     erythromycin ophthalmic ointment; Apply thin ribbon to affected eye BID x 3-5 days   Will trial erythromycin ointment to cover for possible early bacterial conjunctivitis and stye. Follow-up precautions discussed. Recommended continued hand hygiene, fresh pillowcase nightly, and continued compresses.   . Reviewed expectations re: course of current medical issues. . Discussed self-management of symptoms. . Outlined signs and symptoms indicating need for more acute intervention. . Patient verbalized understanding and all questions were answered. . See orders for this visit as documented in the electronic medical record. . Patient received an After-Visit Summary.  CMA or LPN served as scribe during this visit. History, Physical, and Plan performed by medical provider. The above documentation has been reviewed and is accurate and complete.   Inda Coke, PA-C

## 2019-04-03 NOTE — Telephone Encounter (Signed)
Spoke to pt told him called [facility] on [address] and they have the eye ointment in stock will send Rx there. Pt verbalized understanding. Rx sent.

## 2019-04-03 NOTE — Patient Instructions (Signed)
It was great to see you!  Please continue compresses Start eye ointment Keep [place] posted  Take care,  Inda Coke PA-C

## 2019-04-03 NOTE — Telephone Encounter (Signed)
See note  Copied from [place] [phone]. Topic: General - Other >> Apr 03, 2019  4:06 PM Rainey Pines A wrote: Patient would like a callback from nurse today before closing. The erythromycin ophthalmic ointment that was sent to pharmacy will not be ready until 12/21 after 4pm. Patient stated that he will be out of town at that time and wants to know where his medication can be sent to today so that he can get his medication today. Best contact [phone]

## 2019-04-03 NOTE — Addendum Note (Signed)
Addended by: Marian Sorrow on: 04/03/2019 04:54 PM   Modules accepted: Orders

## 2019-04-03 NOTE — Telephone Encounter (Signed)
Forwarding

## 2019-06-09 ENCOUNTER — Ambulatory Visit: Payer: Medicare HMO

## 2019-07-13 DIAGNOSIS — M25551 Pain in right hip: Secondary | ICD-10-CM | POA: Diagnosis not present

## 2019-07-16 DIAGNOSIS — N403 Nodular prostate with lower urinary tract symptoms: Secondary | ICD-10-CM | POA: Diagnosis not present

## 2019-07-20 DIAGNOSIS — R351 Nocturia: Secondary | ICD-10-CM | POA: Diagnosis not present

## 2019-07-20 DIAGNOSIS — N402 Nodular prostate without lower urinary tract symptoms: Secondary | ICD-10-CM | POA: Diagnosis not present

## 2019-07-20 DIAGNOSIS — N5201 Erectile dysfunction due to arterial insufficiency: Secondary | ICD-10-CM | POA: Diagnosis not present

## 2019-07-21 IMAGING — DX DG CHEST 2V
2 series · 2 of 2 positions shown · non-contrast
Comparison: None.

CLINICAL DATA: Cough for 4 days

EXAM:
CHEST  2 VIEW

[dg chest 2 view (1 of 2)]
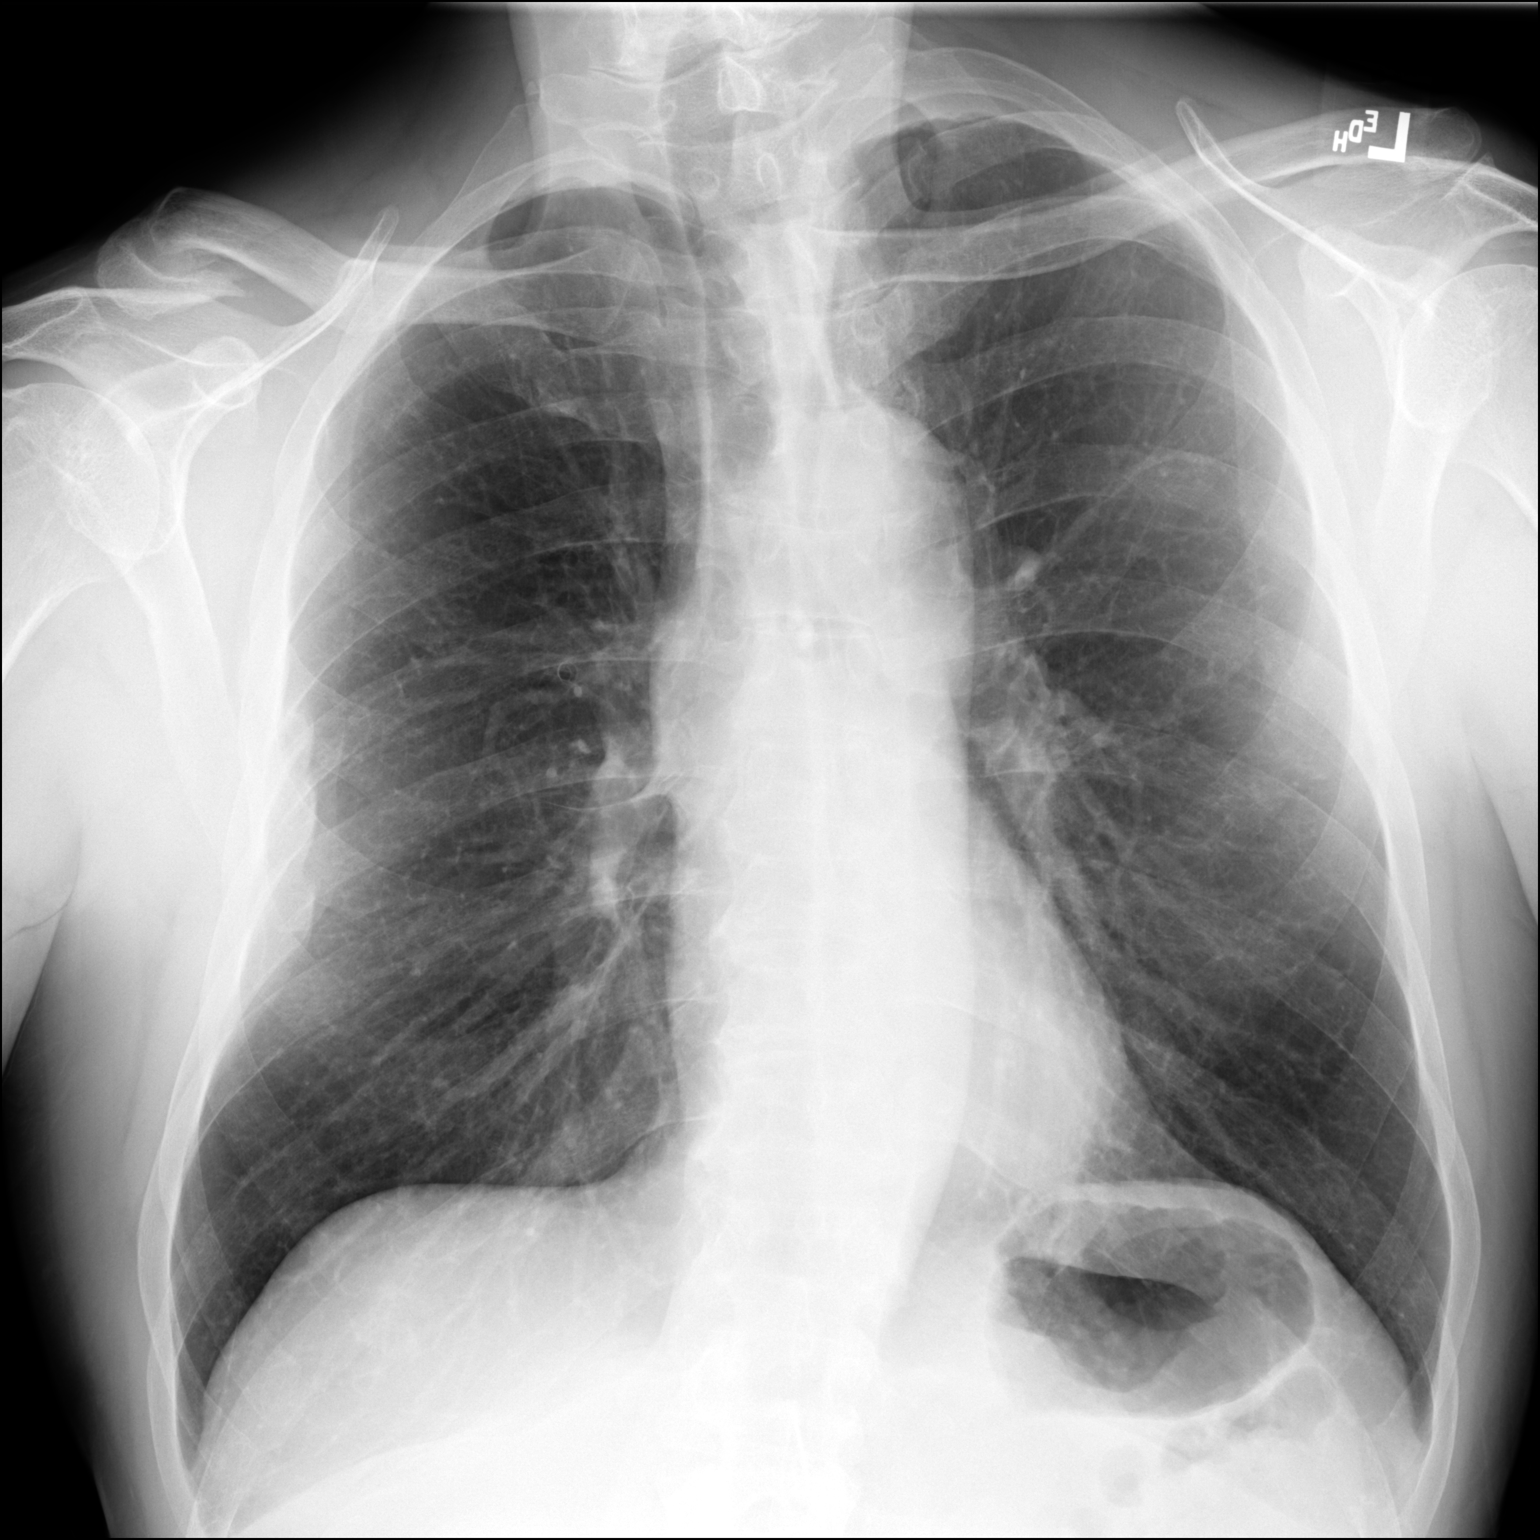

[dg chest 2 view (2 of 2)]
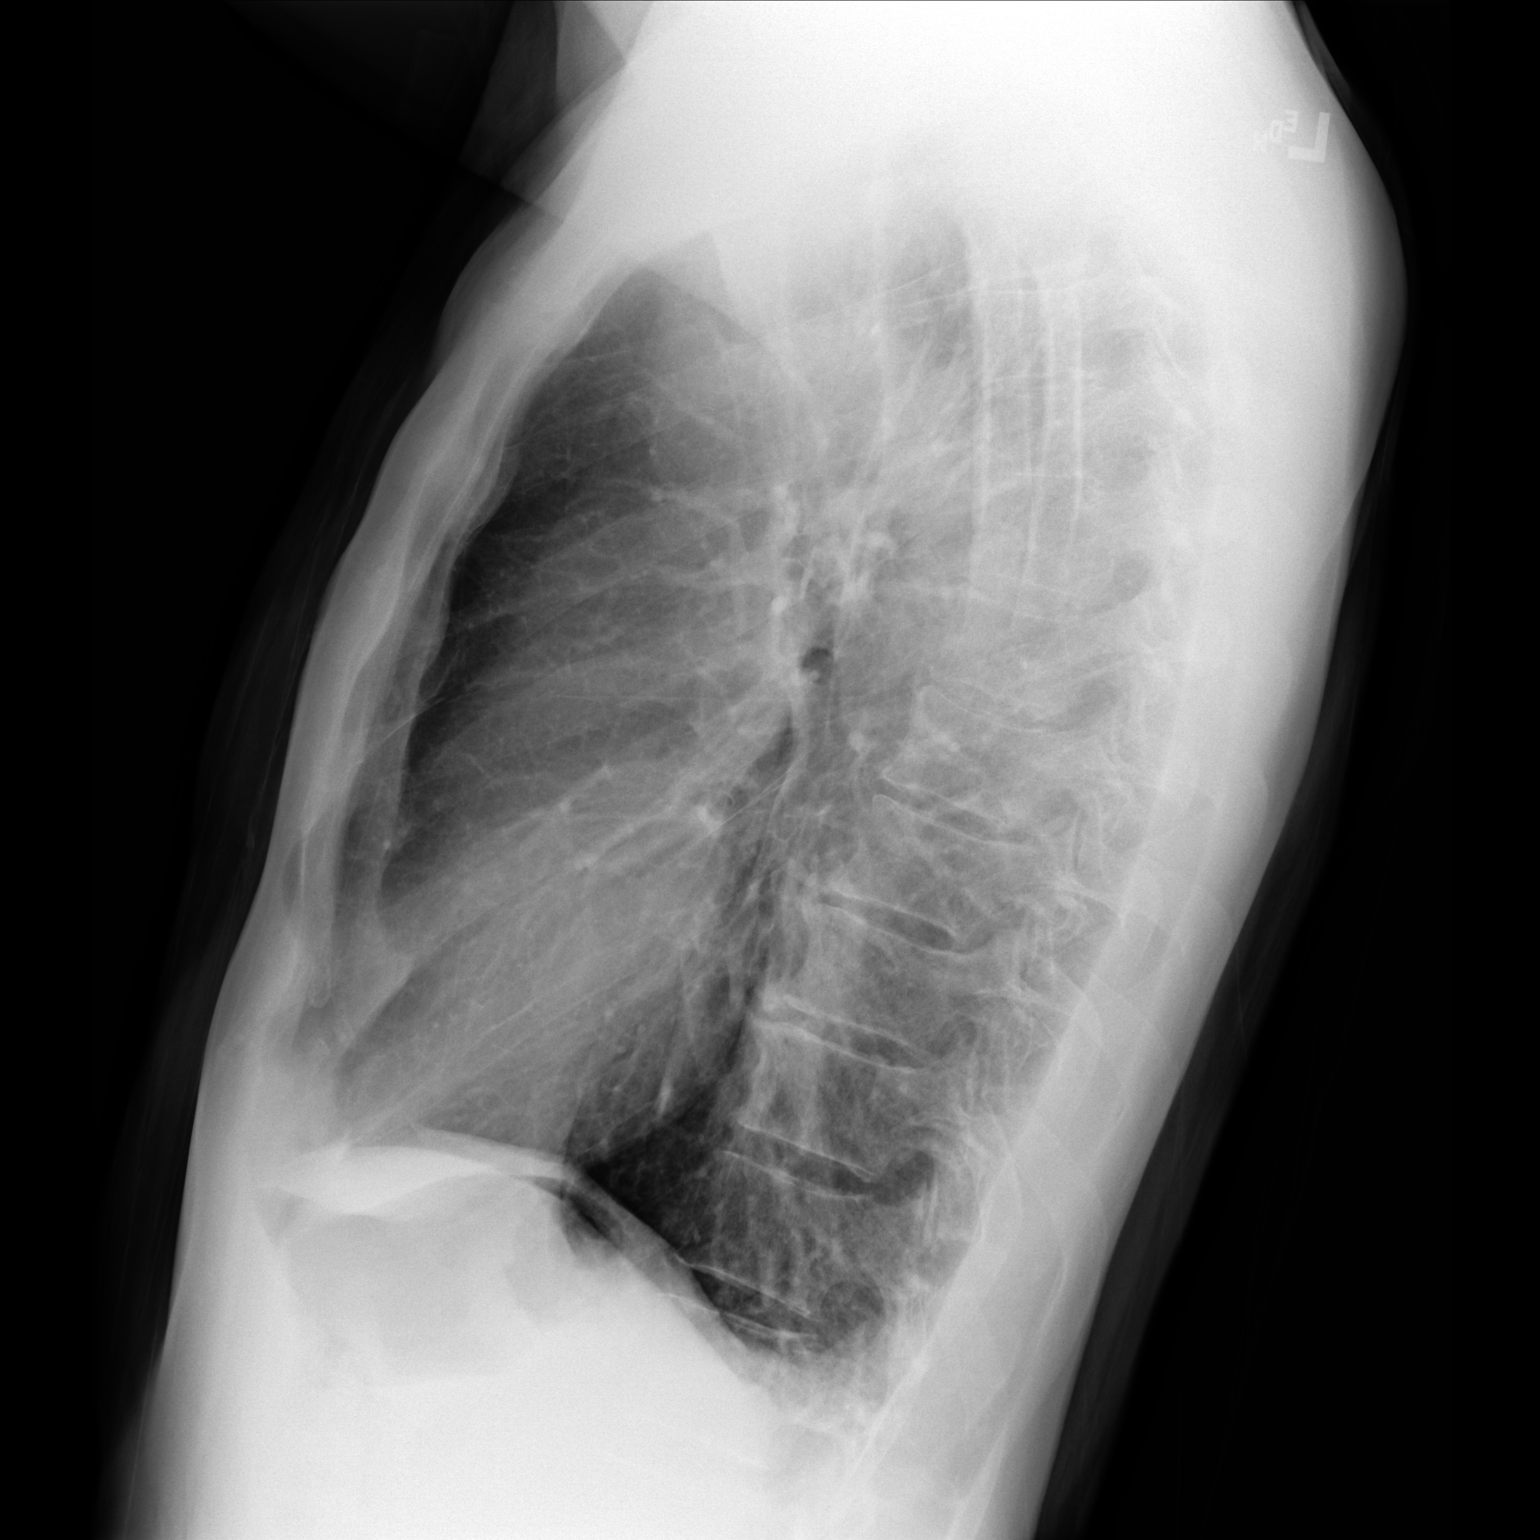

[2 of 2 positions shown; findings below may reference images not displayed]

FINDINGS: There is no edema or consolidation. Heart size and pulmonary
vascularity are normal. No adenopathy. There is evidence of an old
fracture of the lateral right clavicle as well as old rib fractures
on the right.
IMPRESSION: No edema or consolidation.

## 2019-07-23 DIAGNOSIS — R69 Illness, unspecified: Secondary | ICD-10-CM | POA: Diagnosis not present

## 2019-07-27 DIAGNOSIS — M25551 Pain in right hip: Secondary | ICD-10-CM | POA: Diagnosis not present

## 2019-09-30 ENCOUNTER — Ambulatory Visit (INDEPENDENT_AMBULATORY_CARE_PROVIDER_SITE_OTHER): Payer: Medicare HMO | Admitting: Family Medicine

## 2019-09-30 ENCOUNTER — Other Ambulatory Visit: Payer: Self-pay

## 2019-09-30 ENCOUNTER — Encounter: Payer: Self-pay | Admitting: Family Medicine

## 2019-09-30 VITALS — BP 110/78 | HR 54 | Temp 97.9°F | Ht 75.0 in | Wt 196.6 lb

## 2019-09-30 DIAGNOSIS — R5383 Other fatigue: Secondary | ICD-10-CM

## 2019-09-30 DIAGNOSIS — R030 Elevated blood-pressure reading, without diagnosis of hypertension: Secondary | ICD-10-CM

## 2019-09-30 DIAGNOSIS — M8580 Other specified disorders of bone density and structure, unspecified site: Secondary | ICD-10-CM | POA: Diagnosis not present

## 2019-09-30 DIAGNOSIS — H6981 Other specified disorders of Eustachian tube, right ear: Secondary | ICD-10-CM | POA: Diagnosis not present

## 2019-09-30 LAB — COMPREHENSIVE METABOLIC PANEL
ALT: 10 U/L (ref 0–53)
AST: 18 U/L (ref 0–37)
Albumin: 4.1 g/dL (ref 3.5–5.2)
Alkaline Phosphatase: 59 U/L (ref 39–117)
BUN: 17 mg/dL (ref 6–23)
CO2: 31 mEq/L (ref 19–32)
Calcium: 8.8 mg/dL (ref 8.4–10.5)
Chloride: 105 mEq/L (ref 96–112)
Creatinine, Ser: 1.06 mg/dL (ref 0.40–1.50)
GFR: 69.72 mL/min (ref >60.00)
Glucose, Bld: 85 mg/dL (ref 70–99)
Potassium: 4.1 mEq/L (ref 3.5–5.1)
Sodium: 140 mEq/L (ref 135–145)
Total Bilirubin: 0.8 mg/dL (ref 0.2–1.2)
Total Protein: 6.5 g/dL (ref 6.0–8.3)

## 2019-09-30 LAB — CBC
HCT: 40.1 % (ref 39.0–52.0)
Hemoglobin: 14 g/dL (ref 13.0–17.0)
MCHC: 34.9 g/dL (ref 30.0–36.0)
MCV: 91.1 fl (ref 78.0–100.0)
Platelets: 129 10*3/uL — ABNORMAL LOW (ref 150.0–400.0)
RBC: 4.4 Mil/uL (ref 4.22–5.81)
RDW: 13.7 % (ref 11.5–15.5)
WBC: 4.4 10*3/uL (ref 4.0–10.5)

## 2019-09-30 LAB — VITAMIN B12: Vitamin B-12: 617 pg/mL (ref 211–911)

## 2019-09-30 LAB — TESTOSTERONE: Testosterone: 563.85 ng/dL (ref 300.00–890.00)

## 2019-09-30 LAB — TSH: TSH: 0.02 u[IU]/mL — ABNORMAL LOW (ref 0.35–4.50)

## 2019-09-30 LAB — IBC + FERRITIN
Ferritin: 27.9 ng/mL (ref 22.0–322.0)
Iron: 80 ug/dL (ref 42–165)
Saturation Ratios: 27.5 % (ref 20.0–50.0)
Transferrin: 208 mg/dL — ABNORMAL LOW (ref 212.0–360.0)

## 2019-09-30 NOTE — Progress Notes (Signed)
   Wesley Black is a 69 y.o. male who presents today for an office visit.  Assessment/Plan:  New/Acute Problems: Eustachian tube Dysfunction Possibly related to remote head trauma involving right side of his face.  Recommend he try over-the-counter fluticasone.  Will place referral to ENT per his request.  Decreased exercise capacity Possibly related to recent URI.  Will check labs today per patient request including CBC, C met, TSH, testosterone, and B12.  Also check iron levels.  Discussed with patient typical course of recovery and that it takes longer to recover from respiratory illnesses as one ages.  If above labs are normal and continues to have issues with exercise capacity would consider lung imaging or spirometry.  Elevated blood pressure reading Normal today.  Likely elevated several months ago due to lack of exercise.  Discussed importance of regular exercise and low-sodium diet.  Decreased bone density Check DEXA.    Subjective:  HPI:  Patient concerned about decreased exercise capacity over the past several months.  Symptoms started back in January or so.  He had taken a break from cycling at that time due to the colder weather.  He has gotten back up to about 70% or so but over the last month has hit a plateau.  He had upper respiratory infection that he thinks developed into bronchitis about 6 weeks ago.  Symptoms seem to be mostly improving over the last week or so still has difficulty reaching his prior exercise capacity.  He is worried about possible thyroid issue or low testosterone.  He is also worried about his blood pressure readings.  Several months ago he had elevated readings in the 140s.  This was after stopping cycling.  No symptoms.  Blood pressure has gradually normalized over the last several months.  Would also like to have bone density scan done.  He has a home machine and has reportedly told her that he has dropped quite a bit of bone density in the past  few months.  Has also had some issue with a pressure sensation in his right ear.  This also started over the last few months.  He also has high-pitched hearing loss in that ear as well.       Objective:  Physical Exam: BP 110/78   Pulse (!) 54   Temp 97.9 F (36.6 C) (Temporal)   Ht '6\' 3"'$  (1.905 m)   Wt 196 lb 9.6 oz (89.2 kg)   SpO2 98%   BMI 24.57 kg/m   Gen: No acute distress, resting comfortably HEENT: Right TM with mild effusion.  Otherwise clear. CV: Regular rate and rhythm with no murmurs appreciated Pulm: Normal work of breathing, clear to auscultation bilaterally with no crackles, wheezes, or rhonchi Neuro: Grossly normal, moves all extremities Psych: Normal affect and thought content  Time Spent: 43 minutes of total time was spent on the date of the encounter performing the following actions: chart review prior to seeing the patient, obtaining history, performing a medically necessary exam, counseling on the treatment plan including labs and DEXA scan today, placing orders, and documenting in our EHR.        Algis Greenhouse. Jerline Pain, MD 09/30/2019 10:26 AM

## 2019-09-30 NOTE — Patient Instructions (Signed)
It was very nice to see you today!  We will check blood work today.  Depending on the results we may need to do more specialized testing.  I think you have eustachian tube dysfunction.  Please try taking Flonase.  I will also place a referral for you to see ear nose and throat.  I will see you back in a couple of months for your physical.  Take care, Dr [name]  Please try these tips to maintain a healthy lifestyle:   Eat at least 3 REAL meals and 1-2 snacks per day.  Aim for no more than 5 hours between eating.  If you eat breakfast, please do so within one hour of getting up.    Each meal should contain half fruits/vegetables, one quarter protein, and one quarter carbs (no bigger than a computer mouse)   Cut down on sweet beverages. This includes juice, soda, and sweet tea.     Drink at least 1 glass of water with each meal and aim for at least 8 glasses per day   Exercise at least 150 minutes every week.

## 2019-10-01 NOTE — Progress Notes (Signed)
Please inform patient of the following:  Thyroid level was off, but the rest of his blood work was NORMAL. Please have him come back for repeat TSH, free t3, and free t4.  Wesley Black. [name], MD 10/01/2019 7:42 PM

## 2019-10-02 ENCOUNTER — Other Ambulatory Visit (INDEPENDENT_AMBULATORY_CARE_PROVIDER_SITE_OTHER): Payer: Medicare HMO

## 2019-10-02 ENCOUNTER — Other Ambulatory Visit: Payer: Self-pay

## 2019-10-02 DIAGNOSIS — R7989 Other specified abnormal findings of blood chemistry: Secondary | ICD-10-CM | POA: Diagnosis not present

## 2019-10-02 LAB — T4, FREE: Free T4: 1.2 ng/dL (ref 0.60–1.60)

## 2019-10-02 LAB — T3, FREE: T3, Free: 3.7 pg/mL (ref 2.3–4.2)

## 2019-10-02 LAB — TSH: TSH: 0.02 u[IU]/mL — ABNORMAL LOW (ref 0.35–4.50)

## 2019-10-05 NOTE — Progress Notes (Signed)
Please inform patient of the following:  His thyroid numbers are still slightly off. He has subclinical hyperthyroidism. This could explain some of his symptoms. Recommend endocrinology referral to for further work up. Please place referral.  [name], MD 10/05/2019 4:07 PM

## 2019-10-08 ENCOUNTER — Other Ambulatory Visit: Payer: Self-pay | Admitting: *Deleted

## 2019-10-08 ENCOUNTER — Encounter: Payer: Self-pay | Admitting: *Deleted

## 2019-10-08 DIAGNOSIS — E0590 Thyrotoxicosis, unspecified without thyrotoxic crisis or storm: Secondary | ICD-10-CM

## 2019-11-06 ENCOUNTER — Ambulatory Visit: Payer: Medicare HMO | Admitting: Endocrinology

## 2019-11-06 ENCOUNTER — Encounter: Payer: Self-pay | Admitting: Endocrinology

## 2019-11-06 ENCOUNTER — Other Ambulatory Visit: Payer: Self-pay

## 2019-11-06 DIAGNOSIS — E0590 Thyrotoxicosis, unspecified without thyrotoxic crisis or storm: Secondary | ICD-10-CM

## 2019-11-06 MED ORDER — METHIMAZOLE 10 MG PO TABS: 10.0000 mg | ORAL_TABLET | Freq: Every day | ORAL | 5 refills | Status: DC

## 2019-11-06 NOTE — Progress Notes (Signed)
Subjective:    Patient ID: Wesley Black, male    DOB: 12-15-1950, 69 y.o.   MRN: [id number]  HPI Pt is referred by Dr [name], for hyperthyroidism.  Pt reports he was dx'ed with hyperthyroidism last month.  He has never been on therapy for this.  He has never had XRT to the anterior neck, or thyroid surgery.  He has never had thyroid imaging.  He does not consume kelp or any other non-prescribed thyroid medication.  He has never been on amiodarone.  He reports 6 months of fatigue and muscle weakness.   Past Medical History:  Diagnosis Date  . Fracture    Multiple    Past Surgical History:  Procedure Laterality Date  . COLONOSCOPY    . KNEE ARTHROPLASTY Right 2004  . LASIK Bilateral 2001  . SHOULDER SURGERY Left 1984    Social History   Socioeconomic History  . Marital status: Single    Spouse name: Not on file  . Number of children: 0  . Years of education: Master's  . Highest education level: Not on file  Occupational History  . Occupation: Retired  Tobacco Use  . Smoking status: Never Smoker  . Smokeless tobacco: Never Used  Vaping Use  . Vaping Use: Never used  Substance and Sexual Activity  . Alcohol use: Yes    Comment: 2 glasses of wine or beer per month  . Drug use: No  . Sexual activity: Not on file    Comment: Married  Other Topics Concern  . Not on file  Social History Narrative   Lives at home, separated   Left-handed   Caffeine: 1 glass of tea per day, occasional 7 oz Pepsi (every 2 wks)      Social Determinants of Health   Financial Resource Strain:   . Difficulty of Paying Living Expenses:   Food Insecurity:   . Worried About Programme researcher, broadcasting/film/video in the Last Year:   . Barista in the Last Year:   Transportation Needs:   . Freight forwarder (Medical):   Marland Kitchen Lack of Transportation (Non-Medical):   Physical Activity:   . Days of Exercise per Week:   . Minutes of Exercise per Session:   Stress:   . Feeling of Stress :   Social  Connections:   . Frequency of Communication with Friends and Family:   . Frequency of Social Gatherings with Friends and Family:   . Attends Religious Services:   . Active Member of Clubs or Organizations:   . Attends Banker Meetings:   Marland Kitchen Marital Status:   Intimate Partner Violence:   . Fear of Current or Ex-Partner:   . Emotionally Abused:   Marland Kitchen Physically Abused:   . Sexually Abused:     Current Outpatient Medications on File Prior to Visit  Medication Sig Dispense Refill  . b complex vitamins tablet Take 1 tablet by mouth daily.     No current facility-administered medications on file prior to visit.    No Known Allergies  Family History  Problem Relation Age of Onset  . Hypertension Mother   . Arthritis Mother   . Hyperlipidemia Mother   . Prostate cancer Father   . Cancer Father   . Hyperlipidemia Father   . Hypertension Father   . Prostate cancer Brother   . Obesity Brother   . Hyperthyroidism Brother   . Asthma Maternal Grandmother   . Hyperlipidemia Maternal Grandfather   .  Hypertension Maternal Grandfather   . Stroke Maternal Grandfather   . COPD Paternal Grandfather   . Colon cancer Neg Hx   . Esophageal cancer Neg Hx   . Rectal cancer Neg Hx   . Stomach cancer Neg Hx     BP (!) 130/64   Pulse 59   Ht 6\' 3"  (1.905 m)   Wt 196 lb (88.9 kg)   SpO2 96%   BMI 24.50 kg/m    Review of Systems denies weight loss, palpitations, sob, excessive diaphoresis, tremor, anxiety, and heat intolerance.       Objective:   Physical Exam VS: see vs page GEN: no distress HEAD: head: no deformity eyes: no periorbital swelling, no proptosis external nose and ears are normal NECK: supple, thyroid is not enlarged.  CHEST WALL: no deformity LUNGS: clear to auscultation CV: reg rate and rhythm, no murmur.  MUSCULOSKELETAL: muscle bulk and strength are grossly normal.  no obvious joint swelling.  gait is normal and steady.   EXTEMITIES: no deformity.   no edema PULSES: no carotid bruit NEURO:  cn 2-12 grossly intact.   readily moves all 4's.  sensation is intact to touch on all 4's.  No tremor.  SKIN:  Normal texture and temperature.  No rash or suspicious lesion is visible.  Not diaphoretic NODES:  None palpable at the neck PSYCH: alert, well-oriented.  Does not appear anxious nor depressed.  Lab Results  Component Value Date   TSH 0.02 (L) 10/02/2019       Assessment & Plan:  Hyperthyroidism, new to me.    Patient Instructions  I have sent a prescription to your pharmacy, to slow the thyroid. If ever you have fever while taking methimazole, stop it and call 10/04/2019, even if the reason is obvious, because of the risk of a rare side-effect. It is best to never miss the medication.  However, if you do miss it, next best is to double up the next time. Please come back for a follow-up appointment in 1 month.

## 2019-11-06 NOTE — Patient Instructions (Addendum)
I have sent a prescription to your pharmacy, to slow the thyroid. If ever you have fever while taking methimazole, stop it and call us, even if the reason is obvious, because of the risk of a rare side-effect. It is best to never miss the medication.  However, if you do miss it, next best is to double up the next time.   Please come back for a follow-up appointment in 1 month. 

## 2019-11-08 DIAGNOSIS — E0590 Thyrotoxicosis, unspecified without thyrotoxic crisis or storm: Secondary | ICD-10-CM | POA: Insufficient documentation

## 2019-11-12 DIAGNOSIS — H903 Sensorineural hearing loss, bilateral: Secondary | ICD-10-CM | POA: Diagnosis not present

## 2019-11-12 DIAGNOSIS — H9113 Presbycusis, bilateral: Secondary | ICD-10-CM | POA: Insufficient documentation

## 2019-11-12 DIAGNOSIS — H9193 Unspecified hearing loss, bilateral: Secondary | ICD-10-CM | POA: Diagnosis not present

## 2019-11-19 ENCOUNTER — Ambulatory Visit: Payer: Medicare HMO | Admitting: Endocrinology

## 2019-11-23 ENCOUNTER — Encounter: Payer: Self-pay | Admitting: Physician Assistant

## 2019-11-23 ENCOUNTER — Other Ambulatory Visit: Payer: Self-pay

## 2019-11-23 ENCOUNTER — Ambulatory Visit (INDEPENDENT_AMBULATORY_CARE_PROVIDER_SITE_OTHER): Payer: Medicare HMO | Admitting: Physician Assistant

## 2019-11-23 VITALS — BP 118/70 | HR 65 | Temp 97.9°F | Resp 18 | Ht 75.0 in | Wt 191.8 lb

## 2019-11-23 DIAGNOSIS — H9312 Tinnitus, left ear: Secondary | ICD-10-CM | POA: Diagnosis not present

## 2019-11-23 DIAGNOSIS — L237 Allergic contact dermatitis due to plants, except food: Secondary | ICD-10-CM

## 2019-11-23 MED ORDER — METHYLPREDNISOLONE ACETATE 80 MG/ML IJ SUSP
80.0000 mg | Freq: Once | INTRAMUSCULAR | Status: AC
Start: 2019-11-23 — End: 2019-11-23
  Administered 2019-11-23: 80 mg via INTRAMUSCULAR

## 2019-11-23 MED ORDER — PREDNISONE 10 MG PO TABS
ORAL_TABLET | ORAL | 0 refills | Status: DC
Start: 2019-11-23 — End: 2019-12-11

## 2019-11-23 NOTE — Patient Instructions (Signed)
It was great to see you!  Start oral prednisone tomorrow.  Follow-up with ENT/audiology if symptoms worsen or persist.  Take care,  Jarold Motto PA-C

## 2019-11-23 NOTE — Progress Notes (Signed)
Wesley Black is a 69 y.o. male here for a new problem.  I acted as a Neurosurgeon for [employer], PA-C [employer], [place]  History of Present Illness:   Chief Complaint  Patient presents with  . Posion [place]    He stated that he picked it up Tuesday while in [place]. He was working in the grape vine. He had a couple of bumps on wednesday. Thursday is when the full breakout happened. He has been using otc cream.     HPI  Poison Ivy History of poison ivy in the past that responds well to prednisone injections. 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 Had a few bumps on L lower arm on Wednesday and then a significant eruption of rash/itching occurred on Thursday. Since that time he has had worsening spread to his b/l legs and arm. Denies airway/oral involvement, fever. Using OTC cream without significant relief.  Tinnitus Last week was exposed to really loud noise that caused some sensation of cracking/glass breaking afterwards. Coincidentally also saw an ENT and audiologist a few weeks, has follow-up to discuss possible hearing aids.    Past Medical History:  Diagnosis Date  . Fracture    Multiple     Social History   Tobacco Use  . Smoking status: Never Smoker  . Smokeless tobacco: Never Used  Vaping Use  . Vaping Use: Never used  Substance Use Topics  . Alcohol use: Yes    Comment: 2 glasses of wine or beer per month  . Drug use: No    Past Surgical History:  Procedure Laterality Date  . COLONOSCOPY    . KNEE ARTHROPLASTY Right 2004  . LASIK Bilateral 2001  . SHOULDER SURGERY Left 1984    Family History  Problem Relation Age of Onset  . Hypertension Mother   . Arthritis Mother   . Hyperlipidemia Mother   . Prostate cancer Father   . Cancer Father   . Hyperlipidemia Father   . Hypertension Father   . Prostate cancer Brother   . Obesity Brother   . Hyperthyroidism Brother   . Asthma Maternal Grandmother   . Hyperlipidemia  Maternal Grandfather   . Hypertension Maternal Grandfather   . Stroke Maternal Grandfather   . COPD Paternal Grandfather   . Colon cancer Neg Hx   . Esophageal cancer Neg Hx   . Rectal cancer Neg Hx   . Stomach cancer Neg Hx     No Known Allergies  Current Medications:   Current Outpatient Medications:  .  b complex vitamins tablet, Take 1 tablet by mouth daily., Disp: , Rfl:  .  methimazole (TAPAZOLE) 10 MG tablet, Take 1 tablet (10 mg total) by mouth daily., Disp: 30 tablet, Rfl: 5 .  predniSONE (DELTASONE) 10 MG tablet, Take two tablest daily x 1 week, then one tablet daily x 1 week., Disp: 21 tablet, Rfl: 0   Review of Systems:   ROS Negative unless otherwise specified per HPI.  Vitals:   Vitals:   11/23/19 0949  BP: 118/70  Pulse: 65  Resp: 18  Temp: 97.9 F (36.6 C)  TempSrc: Temporal  SpO2: 98%  Weight: 191 lb 12.8 oz (87 kg)  Height: 6\' 3"  (1.905 m)     Body mass index is 23.97 kg/m.  Physical Exam:   Physical Exam Vitals and nursing note reviewed.  Constitutional:      General: He is not in acute distress.  Appearance: He is well-developed. He is not ill-appearing or toxic-appearing.  HENT:     Right Ear: Tympanic membrane, ear canal and external ear normal. Tympanic membrane is not injected, scarred or perforated.     Left Ear: Tympanic membrane, ear canal and external ear normal. Tympanic membrane is not injected, scarred or perforated.  Cardiovascular:     Rate and Rhythm: Normal rate and regular rhythm.     Pulses: Normal pulses.     Heart sounds: Normal heart sounds, S1 normal and S2 normal.     Comments: No LE edema Pulmonary:     Effort: Pulmonary effort is normal.     Breath sounds: Normal breath sounds.  Skin:    General: Skin is warm and dry.     Comments: Several scattered erythematous blistery patches on bilateral lower extremities and upper extremities; no open lesions  Neurological:     Mental Status: He is alert.     GCS: GCS  eye subscore is 4. GCS verbal subscore is 5. GCS motor subscore is 6.  Psychiatric:        Speech: Speech normal.        Behavior: Behavior normal. Behavior is cooperative.     Assessment and Plan:   Jencarlos was seen today for posion ivy.  Diagnoses and all orders for this visit:  Poison ivy No obvious infection. Prednisone injection done today in office, tolerated well. Will also start oral prednisone regimen tomorrow. May do antihistamine of choice. Worsening precautions advised. -     methylPREDNISolone acetate (DEPO-MEDROL) injection 80 mg  Tinnitus of left ear No red flags on exam. Discussed watchful waiting. Follow-up with ENT if symptoms plateau or worsen.  Other orders -     predniSONE (DELTASONE) 10 MG tablet; Take two tablest daily x 1 week, then one tablet daily x 1 week.  . Reviewed expectations re: course of current medical issues. . Discussed self-management of symptoms. . Outlined signs and symptoms indicating need for more acute intervention. . Patient verbalized understanding and all questions were answered. . See orders for this visit as documented in the electronic medical record. . Patient received an After-Visit Summary.  CMA or LPN served as scribe during this visit. History, Physical, and Plan performed by medical provider. The above documentation has been reviewed and is accurate and complete.  Time spent with patient today was 25 minutes which consisted of chart review, discussing diagnosis, work up, treatment answering questions and documentation.  Jarold Motto, PA-C

## 2019-11-26 ENCOUNTER — Other Ambulatory Visit: Payer: Self-pay

## 2019-11-26 ENCOUNTER — Encounter: Payer: Self-pay | Admitting: Family Medicine

## 2019-11-26 ENCOUNTER — Ambulatory Visit (INDEPENDENT_AMBULATORY_CARE_PROVIDER_SITE_OTHER): Payer: Medicare HMO | Admitting: Family Medicine

## 2019-11-26 VITALS — BP 156/60 | HR 63 | Temp 97.6°F | Ht 75.0 in | Wt 190.2 lb

## 2019-11-26 DIAGNOSIS — L739 Follicular disorder, unspecified: Secondary | ICD-10-CM

## 2019-11-26 DIAGNOSIS — H9190 Unspecified hearing loss, unspecified ear: Secondary | ICD-10-CM | POA: Diagnosis not present

## 2019-11-26 DIAGNOSIS — E0590 Thyrotoxicosis, unspecified without thyrotoxic crisis or storm: Secondary | ICD-10-CM | POA: Diagnosis not present

## 2019-11-26 DIAGNOSIS — Z13220 Encounter for screening for lipoid disorders: Secondary | ICD-10-CM

## 2019-11-26 DIAGNOSIS — Z0001 Encounter for general adult medical examination with abnormal findings: Secondary | ICD-10-CM

## 2019-11-26 LAB — LIPID PANEL
Cholesterol: 136 mg/dL (ref <200)
HDL: 60 mg/dL (ref >=40)
LDL Cholesterol (Calc): 57 mg/dL
Non-HDL Cholesterol (Calc): 76 mg/dL (ref <130)
Total CHOL/HDL Ratio: 2.3 (calc) (ref <5.0)
Triglycerides: 107 mg/dL (ref <150)

## 2019-11-26 NOTE — Patient Instructions (Signed)
It was very nice to see you today!  Please try using Hibiclens to prevent infections under your arms.  Please also make sure that you are using a deodorant and not the next first time.  We will check your cholesterol level today.  Keep an eye on your blood pressure let me know if it does not continue to improve over the next few weeks.  I will see you back in year for your next physical.   Take care, Dr Jerline Pain  Please try these tips to maintain a healthy lifestyle:   Eat at least 3 REAL meals and 1-2 snacks per day.  Aim for no more than 5 hours between eating.  If you eat breakfast, please do so within one hour of getting up.    Each meal should contain half fruits/vegetables, one quarter protein, and one quarter carbs (no bigger than a computer mouse)   Cut down on sweet beverages. This includes juice, soda, and sweet tea.     Drink at least 1 glass of water with each meal and aim for at least 8 glasses per day   Exercise at least 150 minutes every week.    Preventive Care 26 Years and Older, Male Preventive care refers to lifestyle choices and visits with your health care provider that can promote health and wellness. This includes:  A yearly physical exam. This is also called an annual well check.  Regular dental and eye exams.  Immunizations.  Screening for certain conditions.  Healthy lifestyle choices, such as diet and exercise. What can I expect for my preventive care visit? Physical exam Your health care provider will check:  Height and weight. These may be used to calculate body mass index (BMI), which is a measurement that tells if you are at a healthy weight.  Heart rate and blood pressure.  Your skin for abnormal spots. Counseling Your health care provider may ask you questions about:  Alcohol, tobacco, and drug use.  Emotional well-being.  Home and relationship well-being.  Sexual activity.  Eating habits.  History of falls.  Memory  and ability to understand (cognition).  Work and work Statistician. What immunizations do I need?  Influenza (flu) vaccine  This is recommended every year. Tetanus, diphtheria, and pertussis (Tdap) vaccine  You may need a Td booster every 10 years. Varicella (chickenpox) vaccine  You may need this vaccine if you have not already been vaccinated. Zoster (shingles) vaccine  You may need this after age 78. Pneumococcal conjugate (PCV13) vaccine  One dose is recommended after age 59. Pneumococcal polysaccharide (PPSV23) vaccine  One dose is recommended after age 68. Measles, mumps, and rubella (MMR) vaccine  You may need at least one dose of MMR if you were born in 1957 or later. You may also need a second dose. Meningococcal conjugate (MenACWY) vaccine  You may need this if you have certain conditions. Hepatitis A vaccine  You may need this if you have certain conditions or if you travel or work in places where you may be exposed to hepatitis A. Hepatitis B vaccine  You may need this if you have certain conditions or if you travel or work in places where you may be exposed to hepatitis B. Haemophilus influenzae type b (Hib) vaccine  You may need this if you have certain conditions. You may receive vaccines as individual doses or as more than one vaccine together in one shot (combination vaccines). Talk with your health care provider about the risks and benefits  of combination vaccines. What tests do I need? Blood tests  Lipid and cholesterol levels. These may be checked every 5 years, or more frequently depending on your overall health.  Hepatitis C test.  Hepatitis B test. Screening  Lung cancer screening. You may have this screening every year starting at age 52 if you have a 30-pack-year history of smoking and currently smoke or have quit within the past 15 years.  Colorectal cancer screening. All adults should have this screening starting at age 62 and continuing  until age 74. Your health care provider may recommend screening at age 65 if you are at increased risk. You will have tests every 1-10 years, depending on your results and the type of screening test.  Prostate cancer screening. Recommendations will vary depending on your family history and other risks.  Diabetes screening. This is done by checking your blood sugar (glucose) after you have not eaten for a while (fasting). You may have this done every 1-3 years.  Abdominal aortic aneurysm (AAA) screening. You may need this if you are a current or former smoker.  Sexually transmitted disease (STD) testing. Follow these instructions at home: Eating and drinking  Eat a diet that includes fresh fruits and vegetables, whole grains, lean protein, and low-fat dairy products. Limit your intake of foods with high amounts of sugar, saturated fats, and salt.  Take vitamin and mineral supplements as recommended by your health care provider.  Do not drink alcohol if your health care provider tells you not to drink.  If you drink alcohol: ? Limit how much you have to 0-2 drinks a day. ? Be aware of how much alcohol is in your drink. In the [place] one drink equals one 12 oz bottle of beer (355 mL), one 5 oz glass of wine (148 mL), or one 1 oz glass of hard liquor (44 mL). Lifestyle  Take daily care of your teeth and gums.  Stay active. Exercise for at least 30 minutes on 5 or more days each week.  Do not use any products that contain nicotine or tobacco, such as cigarettes, e-cigarettes, and chewing tobacco. If you need help quitting, ask your health care provider.  If you are sexually active, practice safe sex. Use a condom or other form of protection to prevent STIs (sexually transmitted infections).  Talk with your health care provider about taking a low-dose aspirin or statin. What's next?  Visit your health care provider once a year for a well check visit.  Ask your health care provider how  often you should have your eyes and teeth checked.  Stay up to date on all vaccines. This information is not intended to replace advice given to you by your health care provider. Make sure you discuss any questions you have with your health care provider. Document Revised: 03/27/2018 Document Reviewed: 03/27/2018 Elsevier Patient Education  2020 [employer].

## 2019-11-26 NOTE — Progress Notes (Signed)
Chief Complaint:  Wesley Black is a 69 y.o. male who presents today for his annual comprehensive physical exam.    Assessment/Plan:  Elevated blood pressure reading Likely elevated side effect of prednisone and Depo-Medrol injection, earlier this week.  Continue home monitoring goal 150/90 or lower.  Chronic Problems Addressed Today: Decreased hearing Recently saw ENT.  Was instructed to get hearing aid.  Still some concern for eustachian tube dysfunction.  We will continue with watchful waiting for now and patient will let me know if he needs a referral for second opinion.  Hyperthyroidism Continue management per endocrinology.  Currently on methimazole 10 mg daily.  We will follow-up with him later this month.  Folliculitis Recommended using Hibiclens wash a few times weekly.  Also recommended against antiperspirant use.  Preventative Healthcare: Up-to-date on colon cancer screening.  Will check lipid panel today.  Up-to-date on vaccines as well.  Patient Counseling(The following topics were reviewed and/or handout was given):  -Nutrition: Stressed importance of moderation in sodium/caffeine intake, saturated fat and cholesterol, caloric balance, sufficient intake of fresh fruits, vegetables, and fiber.  -Stressed the importance of regular exercise.   -Substance Abuse: Discussed cessation/primary prevention of tobacco, alcohol, or other drug use; driving or other dangerous activities under the influence; availability of treatment for abuse.   -Injury prevention: Discussed safety belts, safety helmets, smoke detector, smoking near bedding or upholstery.   -Sexuality: Discussed sexually transmitted diseases, partner selection, use of condoms, avoidance of unintended pregnancy and contraceptive alternatives.   -Dental health: Discussed importance of regular tooth brushing, flossing, and dental visits.  -Health maintenance and immunizations reviewed. Please refer to Health maintenance  section.  Return to care in 1 year for next preventative visit.     Subjective:  HPI:  He has no acute complaints today.   Lifestyle Diet: Balanced. Plenty of lean proteins, fruits and vegetables.  Exercise: Limited over the last few weeks. Normally likes to cycle.   Depression screen PHQ 2/9 03/06/2019  Decreased Interest 0  Down, Depressed, Hopeless 0  PHQ - 2 Score 0  Altered sleeping -  Tired, decreased energy -  Change in appetite -  Feeling bad or failure about yourself  -  Trouble concentrating -  Moving slowly or fidgety/restless -  Suicidal thoughts -  PHQ-9 Score -  Difficult doing work/chores -    There are no preventive care reminders to display for this patient.   ROS: Per HPI, otherwise a complete review of systems was negative.   PMH:  The following were reviewed and entered/updated in epic: Past Medical History:  Diagnosis Date  . Fracture    Multiple   Patient Active Problem List   Diagnosis Date Noted  . Decreased hearing 11/26/2019  . Hyperthyroidism 11/08/2019  . Cough 04/14/2018  . Folliculitis 04/14/2018  . 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 COLONOSCOPY    . KNEE ARTHROPLASTY Right 2004  . LASIK Bilateral 2001  . SHOULDER SURGERY Left 1984    Family History  Problem Relation Age of Onset  . Hypertension Mother   . Arthritis Mother   . Hyperlipidemia Mother   . Prostate cancer Father   . Cancer Father   . Hyperlipidemia Father   . Hypertension Father   . Prostate cancer Brother   . Obesity Brother   . Hyperthyroidism Brother   . Asthma Maternal Grandmother   . Hyperlipidemia Maternal  Grandfather   . Hypertension Maternal Grandfather   . Stroke Maternal Grandfather   . COPD Paternal Grandfather   . Colon cancer Neg Hx   . Esophageal cancer Neg Hx   . Rectal cancer Neg Hx   . Stomach cancer Neg Hx     Medications- reviewed and  updated Current Outpatient Medications  Medication Sig Dispense Refill  . b complex vitamins tablet Take 1 tablet by mouth daily.    . methimazole (TAPAZOLE) 10 MG tablet Take 1 tablet (10 mg total) by mouth daily. 30 tablet 5  . predniSONE (DELTASONE) 10 MG tablet Take two tablest daily x 1 week, then one tablet daily x 1 week. 21 tablet 0   No current facility-administered medications for this visit.    Allergies-reviewed and updated No Known Allergies  Social History   Socioeconomic History  . Marital status: Single    Spouse name: Not on file  . Number of children: 0  . Years of education: Master's  . Highest education level: Not on file  Occupational History  . Occupation: Retired  Tobacco Use  . Smoking status: Never Smoker  . Smokeless tobacco: Never Used  Vaping Use  . Vaping Use: Never used  Substance and Sexual Activity  . Alcohol use: Yes    Comment: 2 glasses of wine or beer per month  . Drug use: No  . Sexual activity: Not on file    Comment: Married  Other Topics Concern  . Not on file  Social History Narrative   Lives at home, separated   Left-handed   Caffeine: 1 glass of tea per day, occasional 7 oz Pepsi (every 2 wks)      Social Determinants of Health   Financial Resource Strain:   . Difficulty of Paying Living Expenses:   Food Insecurity:   . Worried About Programme researcher, broadcasting/film/video in the Last Year:   . Barista in the Last Year:   Transportation Needs:   . Freight forwarder (Medical):   Marland Kitchen Lack of Transportation (Non-Medical):   Physical Activity:   . Days of Exercise per Week:   . Minutes of Exercise per Session:   Stress:   . Feeling of Stress :   Social Connections:   . Frequency of Communication with Friends and Family:   . Frequency of Social Gatherings with Friends and Family:   . Attends Religious Services:   . Active Member of Clubs or Organizations:   . Attends Banker Meetings:   Marland Kitchen Marital Status:           Objective:  Physical Exam: BP (!) 156/60 (BP Location: Right Arm, Patient Position: Sitting, Cuff Size: Normal)   Pulse 63   Temp 97.6 F (36.4 C) (Temporal)   Ht 6\' 3"  (1.905 m)   Wt 190 lb 3.2 oz (86.3 kg)   SpO2 95%   BMI 23.77 kg/m   Body mass index is 23.77 kg/m. Wt Readings from Last 3 Encounters:  11/26/19 190 lb 3.2 oz (86.3 kg)  11/23/19 191 lb 12.8 oz (87 kg)  11/06/19 196 lb (88.9 kg)   Gen: NAD, resting comfortably HEENT: TMs normal bilaterally. OP clear. No thyromegaly noted.  CV: RRR with no murmurs appreciated Pulm: NWOB, CTAB with no crackles, wheezes, or rhonchi GI: Normal bowel sounds present. Soft, Nontender, Nondistended. MSK: no edema, cyanosis, or clubbing noted Skin: warm, dry Neuro: CN2-12 grossly intact. Strength 5/5 in upper and lower extremities. Reflexes symmetric  and intact bilaterally.  Psych: Normal affect and thought content     Milton Streicher M. [name], MD 11/26/2019 9:33 AM

## 2019-11-26 NOTE — Assessment & Plan Note (Signed)
Recommended using Hibiclens wash a few times weekly.  Also recommended against antiperspirant use.

## 2019-11-26 NOTE — Assessment & Plan Note (Signed)
Continue management per endocrinology.  Currently on methimazole 10 mg daily.  We will follow-up with him later this month.

## 2019-11-26 NOTE — Assessment & Plan Note (Signed)
Recently saw ENT.  Was instructed to get hearing aid.  Still some concern for eustachian tube dysfunction.  We will continue with watchful waiting for now and patient will let me know if he needs a referral for second opinion.

## 2019-11-27 ENCOUNTER — Encounter: Payer: Self-pay | Admitting: Family Medicine

## 2019-11-27 NOTE — Progress Notes (Signed)
Dr Lavone Neri interpretation of your lab work:  Good news! Cholesterol levels are normal. Keep up the good work and we can recheck in a year.   If you have any additional questions, please give [place] a call or send [place] a message through [place].  Take care, Dr [name]

## 2019-11-30 ENCOUNTER — Other Ambulatory Visit: Payer: Self-pay

## 2019-11-30 DIAGNOSIS — H9190 Unspecified hearing loss, unspecified ear: Secondary | ICD-10-CM

## 2019-12-03 ENCOUNTER — Ambulatory Visit: Payer: Medicare HMO | Admitting: Endocrinology

## 2019-12-08 ENCOUNTER — Other Ambulatory Visit: Payer: Self-pay | Admitting: *Deleted

## 2019-12-08 DIAGNOSIS — H9190 Unspecified hearing loss, unspecified ear: Secondary | ICD-10-CM

## 2019-12-08 NOTE — Telephone Encounter (Signed)
ENT referral placed.

## 2019-12-08 NOTE — Progress Notes (Signed)
ent

## 2019-12-10 ENCOUNTER — Telehealth: Payer: Self-pay

## 2019-12-10 ENCOUNTER — Other Ambulatory Visit: Payer: Self-pay | Admitting: *Deleted

## 2019-12-10 DIAGNOSIS — H9190 Unspecified hearing loss, unspecified ear: Secondary | ICD-10-CM

## 2019-12-10 NOTE — Telephone Encounter (Signed)
Pt would still like a new referral to an ENT. He is asking for possibly a referral to a Dr. Ezzard Standing or anyone that we recommend.

## 2019-12-10 NOTE — Telephone Encounter (Signed)
Referral placed.

## 2019-12-11 ENCOUNTER — Ambulatory Visit: Payer: Medicare HMO | Admitting: Endocrinology

## 2019-12-11 ENCOUNTER — Encounter: Payer: Self-pay | Admitting: Endocrinology

## 2019-12-11 ENCOUNTER — Other Ambulatory Visit: Payer: Self-pay

## 2019-12-11 VITALS — BP 126/72 | HR 74 | Ht 75.0 in | Wt 195.8 lb

## 2019-12-11 DIAGNOSIS — E0590 Thyrotoxicosis, unspecified without thyrotoxic crisis or storm: Secondary | ICD-10-CM

## 2019-12-11 LAB — TSH: TSH: 0.02 u[IU]/mL — ABNORMAL LOW (ref 0.35–4.50)

## 2019-12-11 LAB — T4, FREE: Free T4: 1.11 ng/dL (ref 0.60–1.60)

## 2019-12-11 MED ORDER — METHIMAZOLE 10 MG PO TABS
10.0000 mg | ORAL_TABLET | Freq: Two times a day (BID) | ORAL | 5 refills | Status: DC
Start: 2019-12-11 — End: 2020-05-18

## 2019-12-11 NOTE — Patient Instructions (Addendum)
I have sent a prescription to your pharmacy, to slow the thyroid. If ever you have fever while taking methimazole, stop it and call us, even if the reason is obvious, because of the risk of a rare side-effect. It is best to never miss the medication.  However, if you do miss it, next best is to double up the next time. Please come back for a follow-up appointment in 2 months.        Hyperthyroidism  Hyperthyroidism is when the thyroid gland is too active (overactive). The thyroid gland is a small gland located in the lower front part of the neck, just in front of the windpipe (trachea). This gland makes hormones that help control how the body uses food for energy (metabolism) as well as how the heart and brain function. These hormones also play a role in keeping your bones strong. When the thyroid is overactive, it produces too much of a hormone called thyroxine. What are the causes? This condition may be caused by:  Graves' disease. This is a disorder in which the body's disease-fighting system (immune system) attacks the thyroid gland. This is the most common cause.  Inflammation of the thyroid gland.  A tumor in the thyroid gland.  Use of certain medicines, including: ? Prescription thyroid hormone replacement. ? Herbal supplements that mimic thyroid hormones. ? Amiodarone therapy.  Solid or fluid-filled lumps within your thyroid gland (thyroid nodules).  Taking in a large amount of iodine from foods or medicines. What increases the risk? You are more likely to develop this condition if:  You are male.  You have a family history of thyroid conditions.  You smoke tobacco.  You use a medicine called lithium.  You take medicines that affect the immune system (immunosuppressants). What are the signs or symptoms? Symptoms of this condition include:  Nervousness.  Inability to tolerate heat.  Unexplained weight loss.  Diarrhea.  Change in the texture of hair or  skin.  Heart skipping beats or making extra beats.  Rapid heart rate.  Loss of menstruation.  Shaky hands.  Fatigue.  Restlessness.  Sleep problems.  Enlarged thyroid gland or a lump in the thyroid (nodule). You may also have symptoms of Graves' disease, which may include:  Protruding eyes.  Dry eyes.  Red or swollen eyes.  Problems with vision. How is this diagnosed? This condition may be diagnosed based on:  Your symptoms and medical history.  A physical exam.  Blood tests.  Thyroid ultrasound. This test involves using sound waves to produce images of the thyroid gland.  A thyroid scan. A radioactive substance is injected into a vein, and images show how much iodine is present in the thyroid.  Radioactive iodine uptake test (RAIU). A small amount of radioactive iodine is given by mouth to see how much iodine the thyroid absorbs after a certain amount of time. How is this treated? Treatment depends on the cause and severity of the condition. Treatment may include:  Medicines to reduce the amount of thyroid hormone your body makes.  Radioactive iodine treatment (radioiodine therapy). This involves swallowing a small dose of radioactive iodine, in capsule or liquid form, to kill thyroid cells.  Surgery to remove part or all of your thyroid gland. You may need to take thyroid hormone replacement medicine for the rest of your life after thyroid surgery.  Medicines to help manage your symptoms. Follow these instructions at home:   Take over-the-counter and prescription medicines only as told by your health  care provider.  Do not use any products that contain nicotine or tobacco, such as cigarettes and e-cigarettes. If you need help quitting, ask your health care provider.  Follow any instructions from your health care provider about diet. You may be instructed to limit foods that contain iodine.  Keep all follow-up visits as told by your health care provider.  This is important. ? You will need to have blood tests regularly so that your health care provider can monitor your condition. Contact a health care provider if:  Your symptoms do not get better with treatment.  You have a fever.  You are taking thyroid hormone replacement medicine and you: ? Have symptoms of depression. ? Feel like you are tired all the time. ? Gain weight. Get help right away if:  You have chest pain.  You have decreased alertness or a change in your awareness.  You have abdominal pain.  You feel dizzy.  You have a rapid heartbeat.  You have an irregular heartbeat.  You have difficulty breathing. Summary  The thyroid gland is a small gland located in the lower front part of the neck, just in front of the windpipe (trachea).  Hyperthyroidism is when the thyroid gland is too active (overactive) and produces too much of a hormone called thyroxine.  The most common cause is Graves' disease, a disorder in which your immune system attacks the thyroid gland.  Hyperthyroidism can cause various symptoms, such as unexplained weight loss, nervousness, inability to tolerate heat, or changes in your heartbeat.  Treatment may include medicine to reduce the amount of thyroid hormone your body makes, radioiodine therapy, surgery, or medicines to manage symptoms. This information is not intended to replace advice given to you by your health care provider. Make sure you discuss any questions you have with your health care provider. Document Revised: 03/15/2017 Document Reviewed: 03/13/2017 Elsevier Patient Education  2020 [employer]  Radioiodine (I-131) Therapy for Hyperthyroidism Radioiodine (I-131) therapy is a treatment for an overactive thyroid gland (hyperthyroidism). The thyroid is a gland in the neck that uses iodine to help control how the body uses food (metabolism). This treatment involves swallowing a pill or liquid that contains I-131. I-131 is manufactured  (synthetic) iodine that gives off radiation. After it is swallowed, the I-131 will be absorbed by the thyroid gland over the next few months. It will destroy thyroid cells and reverse hyperthyroidism. Tell a health care provider about:  Any allergies you have.  All medicines you are taking, including vitamins, herbs, eye drops, creams, and over-the-counter medicines.  Any blood disorders you have.  Any surgeries you have had.  Any medical conditions you have.  Whether you are pregnant, may be pregnant, or have gone through menopause, if this applies.  Whether you currently have children.  Whether you are breastfeeding.  Whether you plan to have children in the next 2 years.  Any contact you have with children or pregnant women.  Your travel plans for the next 3 months.  Whether you pass through radiation detectors for work or travel. What are the risks? Generally, this is a safe procedure. However, problems may occur, including:  Damage to other structures or organs, such as the salivary glands. This could lead to dry mouth and loss of taste.  Low sperm count, if this applies. This may lead to temporary infertility.  Sore throat or neck pain. This is temporary.  Slightly increased risk of thyroid cancer.  Nausea or vomiting. What happens before the  procedure? Staying hydrated  Follow instructions from your health care provider about hydration, which may include: ? Up to 2 hours before the procedure - you may continue to drink clear liquids, such as water, clear fruit juice, black coffee, and plain tea. Eating and drinking restrictions  Follow instructions from your health care provider about eating and drinking restrictions.  Follow a low-iodine diet as told by your health care provider. Check ingredients on packaged foods and drinks because there are foods that you will need to avoid while on the low-iodine diet: ? Avoid iodized table salt and foods that have iodized  salt. ? Avoid seafood, seaweed, soybeans, and soy products. ? Avoid dairy products and eggs. ? Avoid the food dye Red No. 3 because it has iodine. Medicines   Ask your health care provider about: ? Changing or stopping your regular medicines. This is especially important if you are taking diabetes medicines, blood thinners, or thyroid medicines. ? Taking over-the-counter medicines, vitamins, herbs, and supplements. General instructions  Women may be asked to take a pregnancy test.  Women who are breastfeeding should: ? Plan to stop at least 6 weeks before the procedure. ? Not go back to breastfeeding after the procedure until their health care provider approves.  Plan to avoid contact with other people for 1 week after your treatment. Avoiding contact with children and pregnant women is especially important. To do this, plan to stay home from work, arrange child care, and sleep alone, if these things apply to you.  Plan to drive yourself home after treatment. Do not take public transportation. If you need someone to drive you home, sit as far away from the driver as possible. What happens during the procedure?  You will be given a dose of I-131 to swallow. It may be a pill or a liquid.  Your thyroid gland will absorb the I-131 over the next 3 months. The treatment process will be complete in about 6 months. What happens after the procedure?  You may need to stay in the hospital for 24 hours after your treatment. This depends on the requirements in your state.  Follow instructions from your health care provider about: ? How to take care of yourself after the procedure. ? How to protect others from exposure to radiation as it leaves your body. Summary  Radioiodine (I-131) therapy is a treatment for an overactive thyroid gland (hyperthyroidism).  This treatment involves swallowing a pill or liquid that contains I-131. I-131 is manufactured iodine that gives off radiation.  Your  thyroid gland will absorb the I-131 over the next 3 months. The I-131 destroys thyroid cells and reverses hyperthyroidism.  Follow instructions from your health care provider about how to take care of yourself and how to protect other people from exposure to radiation after the procedure. This information is not intended to replace advice given to you by your health care provider. Make sure you discuss any questions you have with your health care provider. Document Revised: 05/15/2018 Document Reviewed: 05/15/2018 Elsevier Patient Education  2020 [employer].

## 2019-12-11 NOTE — Progress Notes (Signed)
Subjective:    Patient ID: Wesley Black, male    DOB: Oct 29, 1950, 69 y.o.   MRN: [id number]  HPI Pt returns for f/u of hyperthyroidism (dx'ed 2021; he chose tapazole rx; he has never had thyroid imaging).  Since on tapazole, he feels better in general.   Past Medical History:  Diagnosis Date  . Fracture    Multiple    Past Surgical History:  Procedure Laterality Date  . COLONOSCOPY    . KNEE ARTHROPLASTY Right 2004  . LASIK Bilateral 2001  . SHOULDER SURGERY Left 1984    Social History   Socioeconomic History  . Marital status: Single    Spouse name: Not on file  . Number of children: 0  . Years of education: Master's  . Highest education level: Not on file  Occupational History  . Occupation: Retired  Tobacco Use  . Smoking status: Never Smoker  . Smokeless tobacco: Never Used  Vaping Use  . Vaping Use: Never used  Substance and Sexual Activity  . Alcohol use: Yes    Comment: 2 glasses of wine or beer per month  . Drug use: No  . Sexual activity: Not on file    Comment: Married  Other Topics Concern  . Not on file  Social History Narrative   Lives at home, separated   Left-handed   Caffeine: 1 glass of tea per day, occasional 7 oz Pepsi (every 2 wks)      Social Determinants of Health   Financial Resource Strain:   . Difficulty of Paying Living Expenses: Not on file  Food Insecurity:   . Worried About Programme researcher, broadcasting/film/video in the Last Year: Not on file  . Ran Out of Food in the Last Year: Not on file  Transportation Needs:   . Lack of Transportation (Medical): Not on file  . Lack of Transportation (Non-Medical): Not on file  Physical Activity:   . Days of Exercise per Week: Not on file  . Minutes of Exercise per Session: Not on file  Stress:   . Feeling of Stress : Not on file  Social Connections:   . Frequency of Communication with Friends and Family: Not on file  . Frequency of Social Gatherings with Friends and Family: Not on file  . Attends  Religious Services: Not on file  . Active Member of Clubs or Organizations: Not on file  . Attends Banker Meetings: Not on file  . Marital Status: Not on file  Intimate Partner Violence:   . Fear of Current or Ex-Partner: Not on file  . Emotionally Abused: Not on file  . Physically Abused: Not on file  . Sexually Abused: Not on file    Current Outpatient Medications on File Prior to Visit  Medication Sig Dispense Refill  . b complex vitamins tablet Take 1 tablet by mouth daily.     No current facility-administered medications on file prior to visit.    No Known Allergies  Family History  Problem Relation Age of Onset  . Hypertension Mother   . Arthritis Mother   . Hyperlipidemia Mother   . Prostate cancer Father   . Cancer Father   . Hyperlipidemia Father   . Hypertension Father   . Prostate cancer Brother   . Obesity Brother   . Hyperthyroidism Brother   . Asthma Maternal Grandmother   . Hyperlipidemia Maternal Grandfather   . Hypertension Maternal Grandfather   . Stroke Maternal Grandfather   . COPD  Paternal Grandfather   . Colon cancer Neg Hx   . Esophageal cancer Neg Hx   . Rectal cancer Neg Hx   . Stomach cancer Neg Hx     BP 126/72   Pulse 74   Ht 6\' 3"  (1.905 m)   Wt 195 lb 12.8 oz (88.8 kg)   SpO2 98%   BMI 24.47 kg/m    Review of Systems Denies fever.     Objective:   Physical Exam VITAL SIGNS:  See vs page GENERAL: no distress NECK: There is no palpable thyroid enlargement.  No thyroid nodule is palpable.  No palpable lymphadenopathy at the anterior neck.     Lab Results  Component Value Date   TSH 0.02 (L) 12/11/2019       Assessment & Plan:  Hyperthyroidism: uncontrolled.  I have sent a prescription to your pharmacy, to increase tapazole

## 2019-12-23 ENCOUNTER — Encounter: Payer: Self-pay | Admitting: Family Medicine

## 2019-12-29 DIAGNOSIS — R69 Illness, unspecified: Secondary | ICD-10-CM | POA: Diagnosis not present

## 2020-01-04 ENCOUNTER — Encounter (INDEPENDENT_AMBULATORY_CARE_PROVIDER_SITE_OTHER): Payer: Self-pay | Admitting: Otolaryngology

## 2020-01-04 ENCOUNTER — Other Ambulatory Visit: Payer: Self-pay

## 2020-01-04 ENCOUNTER — Ambulatory Visit (INDEPENDENT_AMBULATORY_CARE_PROVIDER_SITE_OTHER): Payer: Medicare HMO | Admitting: Otolaryngology

## 2020-01-04 VITALS — Temp 97.7°F

## 2020-01-04 DIAGNOSIS — H9312 Tinnitus, left ear: Secondary | ICD-10-CM | POA: Diagnosis not present

## 2020-01-04 DIAGNOSIS — H903 Sensorineural hearing loss, bilateral: Secondary | ICD-10-CM | POA: Diagnosis not present
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
## 2020-01-05 ENCOUNTER — Encounter (INDEPENDENT_AMBULATORY_CARE_PROVIDER_SITE_OTHER): Payer: Self-pay

## 2020-02-11 DIAGNOSIS — R69 Illness, unspecified: Secondary | ICD-10-CM | POA: Diagnosis not present

## 2020-02-15 ENCOUNTER — Encounter: Payer: Self-pay | Admitting: Endocrinology

## 2020-02-15 ENCOUNTER — Other Ambulatory Visit: Payer: Self-pay

## 2020-02-15 ENCOUNTER — Ambulatory Visit (INDEPENDENT_AMBULATORY_CARE_PROVIDER_SITE_OTHER): Payer: Medicare HMO | Admitting: Endocrinology

## 2020-02-15 VITALS — BP 124/82 | HR 55 | Ht 75.0 in | Wt 200.0 lb

## 2020-02-15 DIAGNOSIS — E0590 Thyrotoxicosis, unspecified without thyrotoxic crisis or storm: Secondary | ICD-10-CM | POA: Diagnosis not present

## 2020-02-15 DIAGNOSIS — H43813 Vitreous degeneration, bilateral: Secondary | ICD-10-CM | POA: Diagnosis not present

## 2020-02-15 LAB — T4, FREE: Free T4: 0.98 ng/dL (ref 0.60–1.60)

## 2020-02-15 LAB — TSH: TSH: 1.85 u[IU]/mL (ref 0.35–4.50)

## 2020-02-15 NOTE — Progress Notes (Signed)
Subjective:    Patient ID: Wesley Black, male    DOB: [birth year], 69 y.o.   MRN: [id number]  HPI Pt returns for f/u of hyperthyroidism (dx'ed 2021; he chose tapazole rx; he has never had thyroid imaging).  pt states he feels well in general, except for fatigue.  He says he does not miss the tapazole.   Past Medical History:  Diagnosis Date  . Fracture    Multiple    Past Surgical History:  Procedure Laterality Date  . COLONOSCOPY    . KNEE ARTHROPLASTY Right 2004  . LASIK Bilateral 2001  . SHOULDER SURGERY Left 1984    Social History   Socioeconomic History  . Marital status: Single    Spouse name: Not on file  . Number of children: 0  . Years of education: Master's  . Highest education level: Not on file  Occupational History  . Occupation: Retired  Tobacco Use  . Smoking status: Never Smoker  . Smokeless tobacco: Never Used  Vaping Use  . Vaping Use: Never used  Substance and Sexual Activity  . Alcohol use: Yes    Comment: 2 glasses of wine or beer per month  . Drug use: No  . Sexual activity: Not on file    Comment: Married  Other Topics Concern  . Not on file  Social History Narrative   Lives at home, separated   Left-handed   Caffeine: 1 glass of tea per day, occasional 7 oz Pepsi (every 2 wks)      Social Determinants of Health   Financial Resource Strain:   . Difficulty of Paying Living Expenses: Not on file  Food Insecurity:   . Worried About Programme researcher, broadcasting/film/video in the Last Year: Not on file  . Ran Out of Food in the Last Year: Not on file  Transportation Needs:   . Lack of Transportation (Medical): Not on file  . Lack of Transportation (Non-Medical): Not on file  Physical Activity:   . Days of Exercise per Week: Not on file  . Minutes of Exercise per Session: Not on file  Stress:   . Feeling of Stress : Not on file  Social Connections:   . Frequency of Communication with Friends and Family: Not on file  . Frequency of Social Gatherings  with Friends and Family: Not on file  . Attends Religious Services: Not on file  . Active Member of Clubs or Organizations: Not on file  . Attends Banker Meetings: Not on file  . Marital Status: Not on file  Intimate Partner Violence:   . Fear of Current or Ex-Partner: Not on file  . Emotionally Abused: Not on file  . Physically Abused: Not on file  . Sexually Abused: Not on file    Current Outpatient Medications on File Prior to Visit  Medication Sig Dispense Refill  . b complex vitamins tablet Take 1 tablet by mouth daily.    . methimazole (TAPAZOLE) 10 MG tablet Take 1 tablet (10 mg total) by mouth 2 (two) times daily. 60 tablet 5   No current facility-administered medications on file prior to visit.    No Known Allergies  Family History  Problem Relation Age of Onset  . Hypertension Mother   . Arthritis Mother   . Hyperlipidemia Mother   . Prostate cancer Father   . Cancer Father   . Hyperlipidemia Father   . Hypertension Father   . Prostate cancer Brother   . Obesity Brother   .  Hyperthyroidism Brother   . Asthma Maternal Grandmother   . Hyperlipidemia Maternal Grandfather   . Hypertension Maternal Grandfather   . Stroke Maternal Grandfather   . COPD Paternal Grandfather   . Colon cancer Neg Hx   . Esophageal cancer Neg Hx   . Rectal cancer Neg Hx   . Stomach cancer Neg Hx     BP 124/82   Pulse (!) 55   Ht 6\' 3"  (1.905 m)   Wt 200 lb (90.7 kg)   SpO2 97%   BMI 25.00 kg/m    Review of Systems Denies fever.      Objective:   Physical Exam VITAL SIGNS:  See vs page GENERAL: no distress NECK: There is no palpable thyroid enlargement.  No thyroid nodule is palpable.  No palpable lymphadenopathy at the anterior neck.  Lab Results  Component Value Date   TSH 1.85 02/15/2020       Assessment & Plan:  Hyperthyroidism: well-controlled.  Please continue the same tapazole

## 2020-02-15 NOTE — Patient Instructions (Addendum)
Blood tests are requested for you today.  We'll let you know about the results.   °If ever you have fever while taking methimazole, stop it and call us, even if the reason is obvious, because of the risk of a rare side-effect.   °It is best to never miss the medication.  However, if you do miss it, next best is to double up the next time.   °Please come back for a follow-up appointment in 3 months.   °

## 2020-02-16 ENCOUNTER — Encounter: Payer: Self-pay | Admitting: Family Medicine

## 2020-02-16 ENCOUNTER — Ambulatory Visit (INDEPENDENT_AMBULATORY_CARE_PROVIDER_SITE_OTHER): Payer: Medicare HMO

## 2020-02-16 DIAGNOSIS — Z23 Encounter for immunization: Secondary | ICD-10-CM | POA: Diagnosis not present

## 2020-02-21 IMAGING — DX DG CHEST 2V
2 series · 3 of 3 positions shown · non-contrast
Comparison: 04/12/2017.

CLINICAL DATA: Chronic cough.

EXAM:
CHEST - 2 VIEW

[Series 1: chest pa · 0.14mm/px · 2 of 2 slices shown]
[im 1/2]
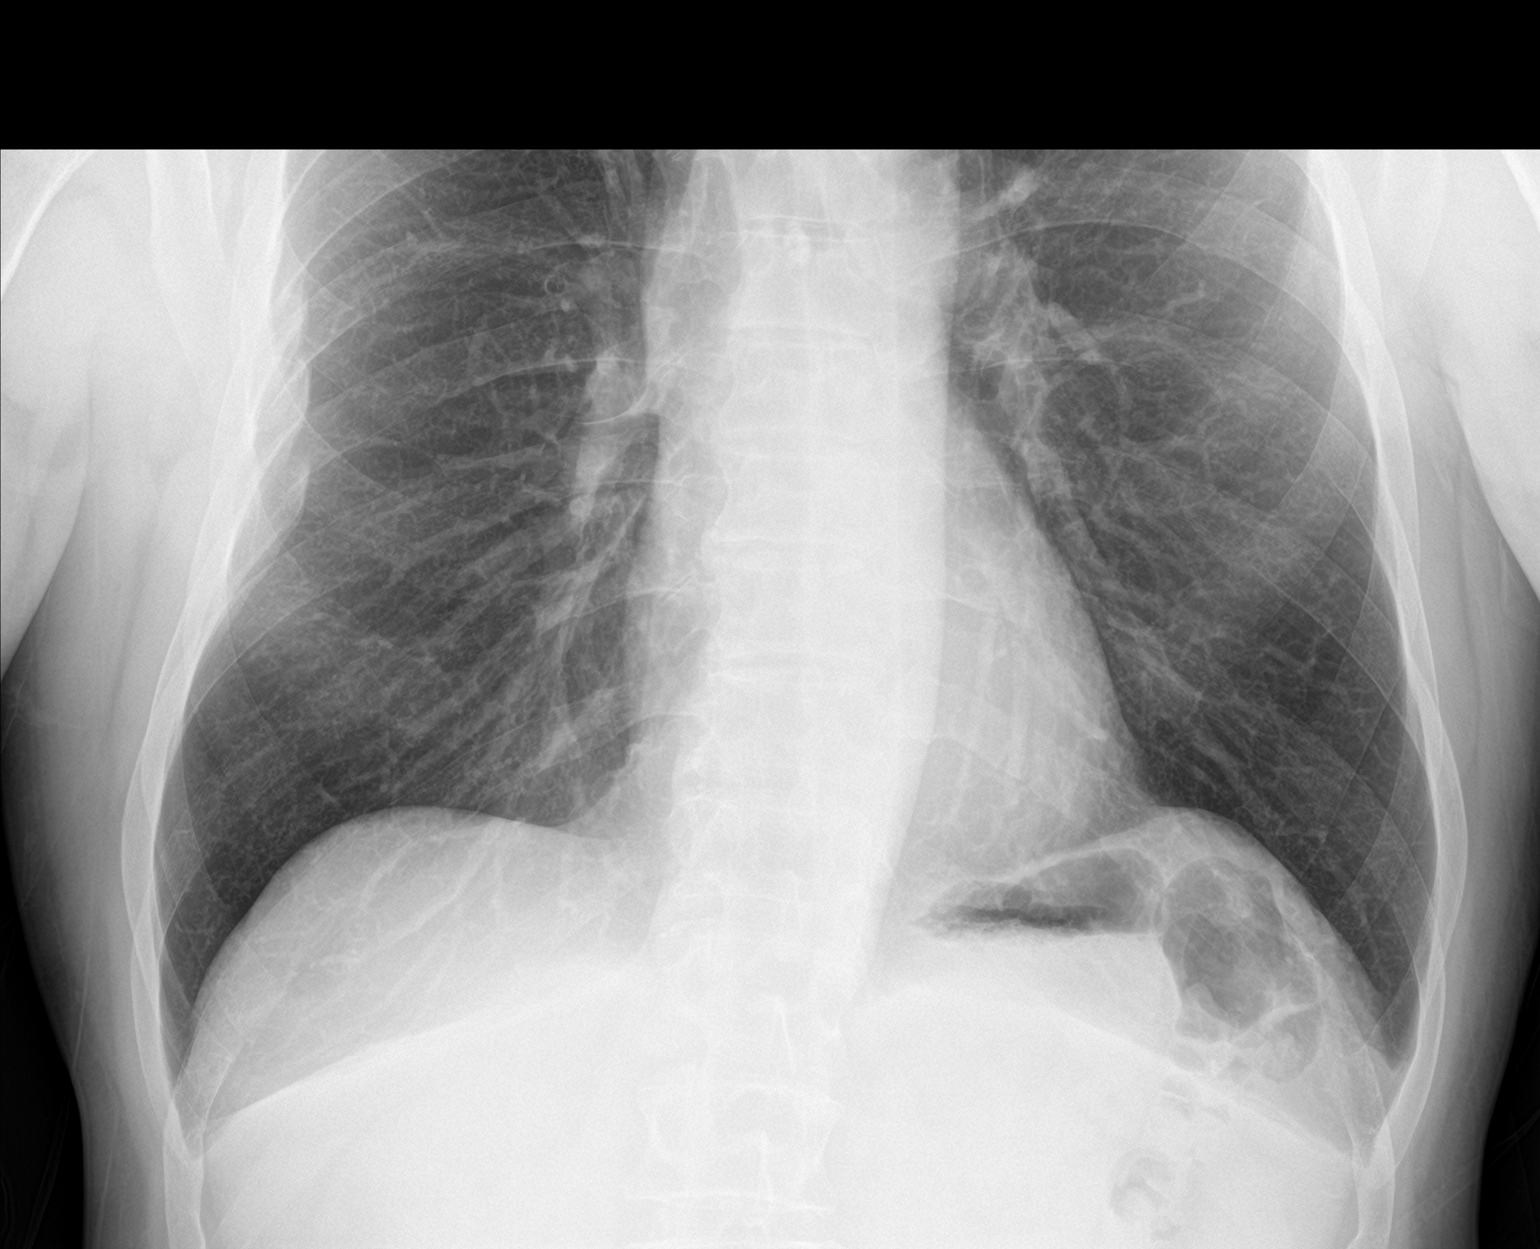
[im 2/2]
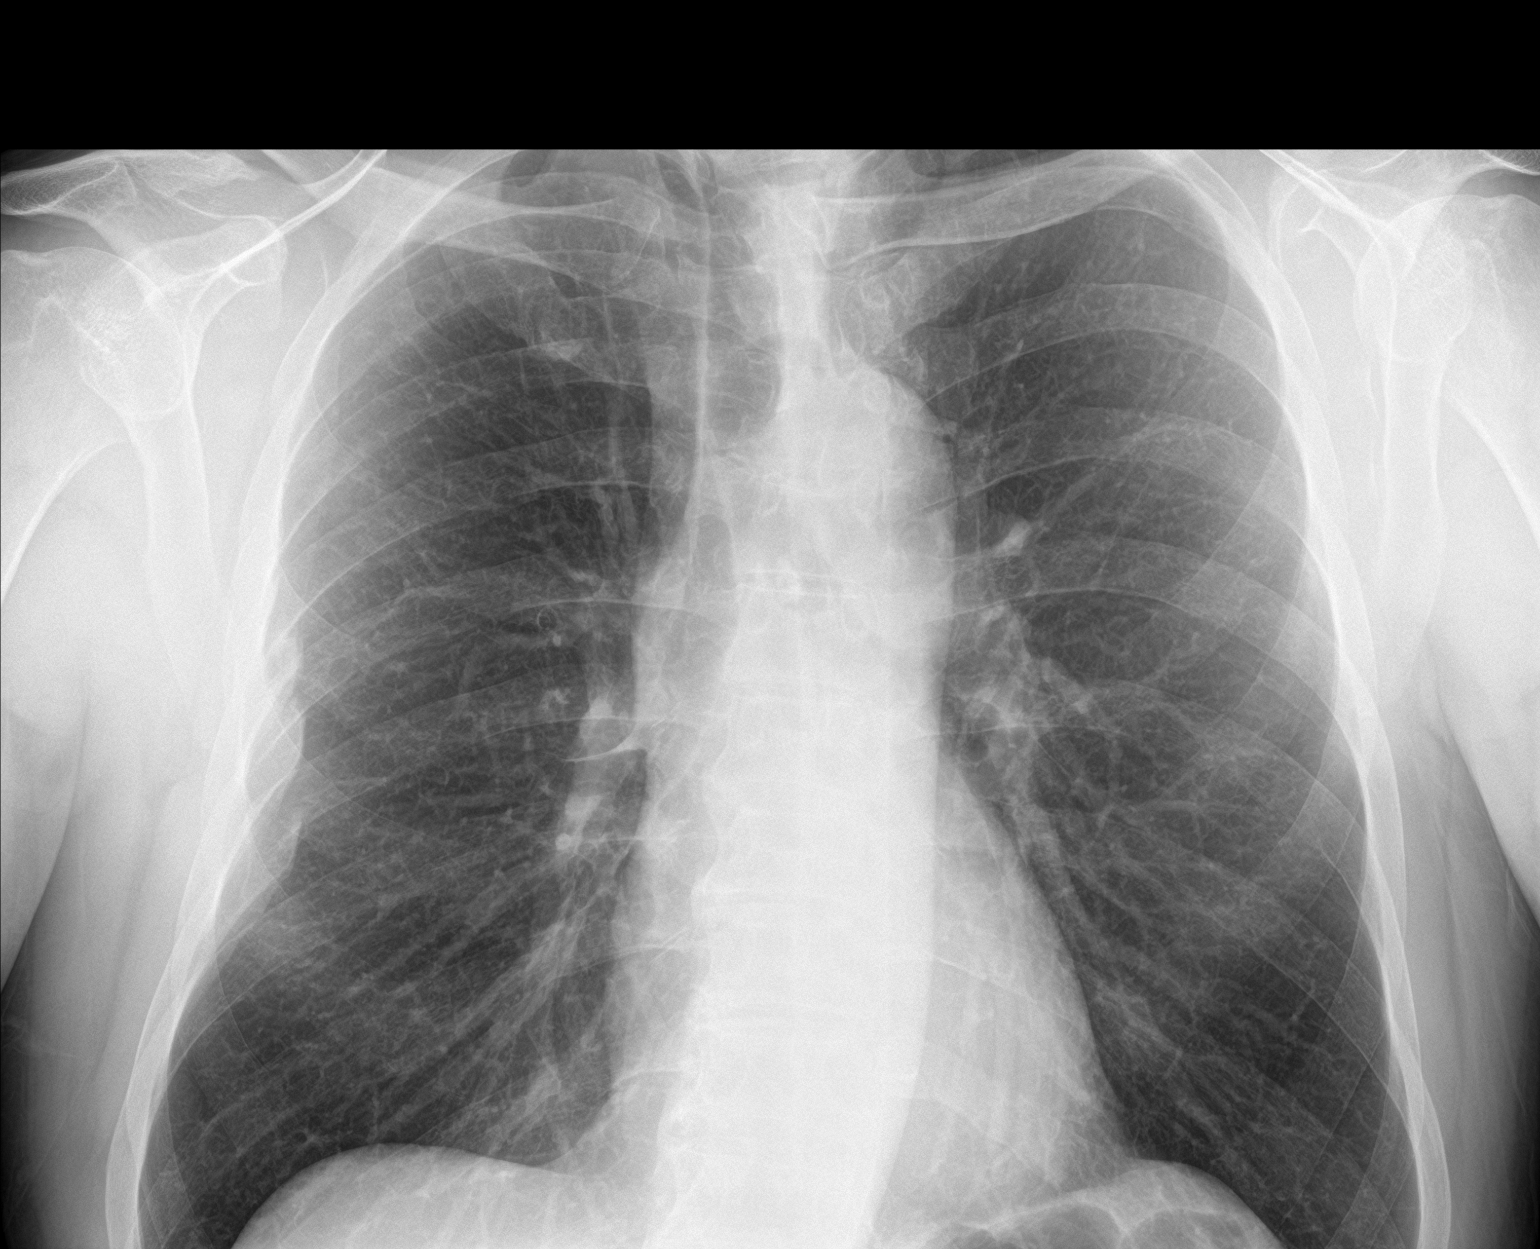

[chest lat]
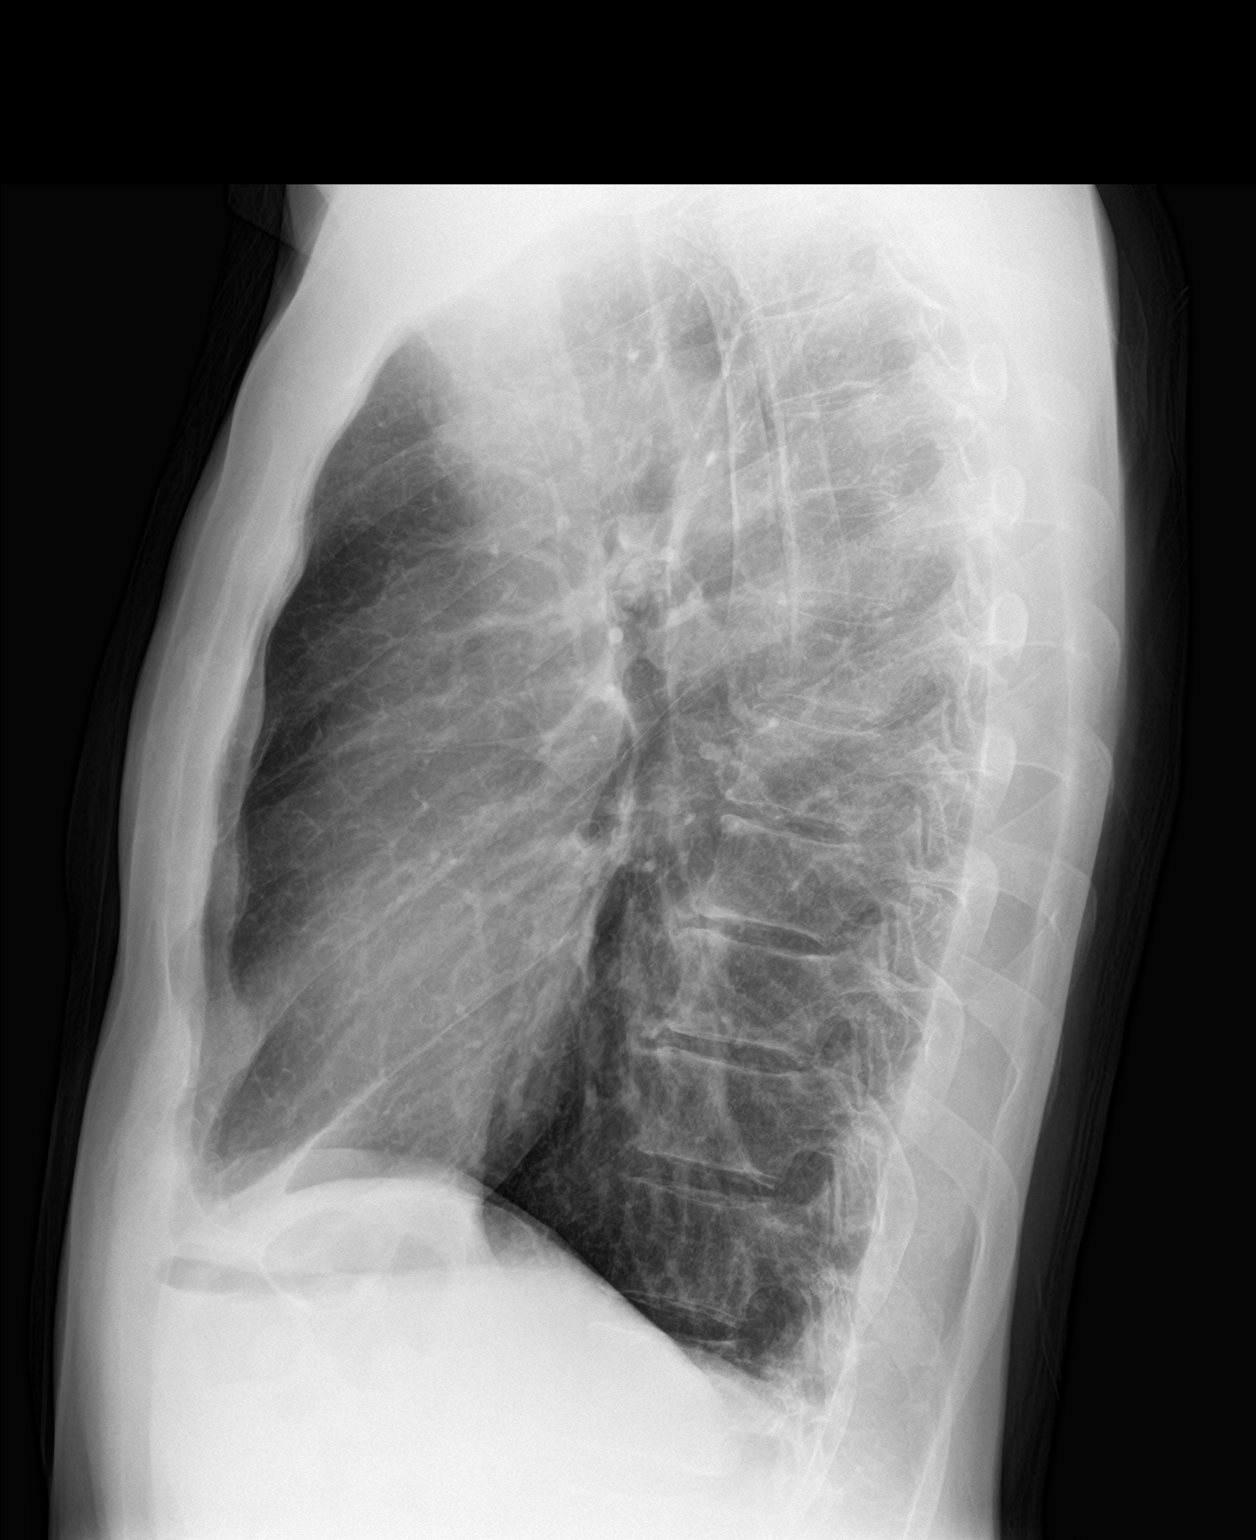

[3 of 3 positions shown; findings below may reference images not displayed]

FINDINGS: Mediastinum and hilar structures are normal. Heart size normal.
Lungs are clear. No pleural effusion or pneumothorax. Costophrenic
angles are incompletely imaged. Stable deformities in the right ribs
consistent old fracture. Old right clavicular fracture again noted.
IMPRESSION: No acute cardiopulmonary disease.

## 2020-02-24 DIAGNOSIS — M7631 Iliotibial band syndrome, right leg: Secondary | ICD-10-CM | POA: Diagnosis not present

## 2020-02-25 ENCOUNTER — Telehealth: Payer: Self-pay

## 2020-02-25 NOTE — Telephone Encounter (Signed)
Pt asked if Dr. [name] would place a referral for him to go to [facility] on [address] to treat his chronic condition. Please advise.

## 2020-02-26 NOTE — Telephone Encounter (Signed)
See below

## 2020-02-26 NOTE — Telephone Encounter (Signed)
Ok to place referral.  Wesley Black. [name], MD 02/26/2020 1:28 PM

## 2020-02-26 NOTE — Telephone Encounter (Signed)
Pt came into office. Pt states Varicose Veins may be involved but it is more for an injury to the lower right leg from years ago and ankle/foot swelling that he has discussed with Dr. [name] about before.   To clarify from previous message, pt wants referral sent to [facility] on [address] [facility].   Pt asked if we could sent him an update from MyChart on the referral.

## 2020-02-26 NOTE — Telephone Encounter (Signed)
Ok to place referral? If so, what to order it under?

## 2020-02-26 NOTE — Telephone Encounter (Signed)
Can we clarify with patient to make sure which condition he needs referral for? I think it may be the varicose veins but want to make sure we are on the same page.

## 2020-02-29 ENCOUNTER — Other Ambulatory Visit: Payer: Self-pay | Admitting: *Deleted

## 2020-02-29 DIAGNOSIS — T1490XA Injury, unspecified, initial encounter: Secondary | ICD-10-CM

## 2020-02-29 NOTE — Telephone Encounter (Signed)
Referral placed.

## 2020-03-25 ENCOUNTER — Other Ambulatory Visit: Payer: Self-pay | Admitting: *Deleted

## 2020-03-25 DIAGNOSIS — M25561 Pain in right knee: Secondary | ICD-10-CM

## 2020-03-25 DIAGNOSIS — M25562 Pain in left knee: Secondary | ICD-10-CM

## 2020-03-30 ENCOUNTER — Encounter: Payer: Self-pay | Admitting: Family Medicine

## 2020-04-05 ENCOUNTER — Ambulatory Visit (HOSPITAL_COMMUNITY)
Admission: RE | Admit: 2020-04-05 | Discharge: 2020-04-05 | Disposition: A | Discharge status: Home / Self Care | Payer: Medicare HMO | Source: Ambulatory Visit | Attending: Physician Assistant | Admitting: Physician Assistant

## 2020-04-05 ENCOUNTER — Ambulatory Visit: Payer: Medicare HMO | Admitting: Physician Assistant

## 2020-04-05 ENCOUNTER — Encounter: Payer: Self-pay | Admitting: Physician Assistant

## 2020-04-05 ENCOUNTER — Other Ambulatory Visit: Payer: Self-pay

## 2020-04-05 VITALS — BP 127/72 | HR 60 | Temp 98.6°F | Resp 20 | Ht 75.0 in | Wt 200.0 lb

## 2020-04-05 DIAGNOSIS — M25561 Pain in right knee: Secondary | ICD-10-CM | POA: Insufficient documentation

## 2020-04-05 DIAGNOSIS — I82811 Embolism and thrombosis of superficial veins of right lower extremities: Secondary | ICD-10-CM | POA: Diagnosis not present

## 2020-04-05 DIAGNOSIS — M25562 Pain in left knee: Secondary | ICD-10-CM | POA: Insufficient documentation

## 2020-04-05 DIAGNOSIS — I872 Venous insufficiency (chronic) (peripheral): Secondary | ICD-10-CM | POA: Diagnosis not present

## 2020-04-05 NOTE — Progress Notes (Signed)
Requested by:  Ardith Dark, MD [address] [place],  [place] [address]  Reason for consultation: right lower extremity swelling and varicose veins   History of Present Illness   Wesley Black is a 69 y.o. (10/14/1950) male who presents for evaluation of varicose veins with swelling. Patient states that dating back to an injury in 1999 he has had issues with enlarged veins in his right leg and swelling. He has had several injuries to the RLE over the years since then, but reports more recently within the last 2 years the swelling has really become significant and interfering with his quality of life. He is an avid cyclist and this has interfered with that as well as just his day to day activities. He says that he has to elevate his legs in bed for 7+ hours at night and has been wearing his OTC compression stockings almost around the clock to try and improve the swelling. The swelling is worsened by prolonged sitting or prolonged standing. He does not report any pain in the leg but sometimes when the swelling is significant he will have numbness/ tingling in his toes. He very rarely will have any swelling in the left leg. He has tried numerous dietary changes and changes in activity to try to improve it. He otherwise is very active and healthy and has not had any luck in getting answers about why his leg is constantly swollen. He did back in 2008 have a DVT in the posterior tibial veins of the right leg after a knee arthroscopy, but otherwise does not have any history of DVT. He does not have any family history of venous disease or DVT  He additionally has concerns today about a family history of a AAA. His father had a AAA incidentally found when undergoing cardiac catheterization. He also reports a cousin who did of a ruptured AAA. He would like to have an evaluation of this  Onset/duration: since 1999 Occupation: retired Aggravating factors: prolonged sitting, standing Alleviating factors:  elevation, compression Compression:  Yes OTC Helps: yes Pain medications: none Previous vein procedures: no History of DVT:  yes  Past Medical History:  Diagnosis Date  . Fracture    Multiple    Past Surgical History:  Procedure Laterality Date  . COLONOSCOPY    . KNEE ARTHROPLASTY Right 2004  . LASIK Bilateral 2001  . SHOULDER SURGERY Left 1984    Social History   Socioeconomic History  . Marital status: Single    Spouse name: Not on file  . Number of children: 0  . Years of education: Master's  . Highest education level: Not on file  Occupational History  . Occupation: Retired  Tobacco Use  . Smoking status: Never Smoker  . Smokeless tobacco: Never Used  Vaping Use  . Vaping Use: Never used  Substance and Sexual Activity  . Alcohol use: Yes    Comment: 2 glasses of wine or beer per month  . Drug use: No  . Sexual activity: Not on file    Comment: Married  Other Topics Concern  . Not on file  Social History Narrative   Lives at home, separated   Left-handed   Caffeine: 1 glass of tea per day, occasional 7 oz Pepsi (every 2 wks)      Social Determinants of Health   Financial Resource Strain: Not on file  Food Insecurity: Not on file  Transportation Needs: Not on file  Physical Activity: Not on file  Stress: Not on file  Social Connections: Not on file  Intimate Partner Violence: Not on file    Family History  Problem Relation Age of Onset  . Hypertension Mother   . Arthritis Mother   . Hyperlipidemia Mother   . Prostate cancer Father   . Cancer Father   . Hyperlipidemia Father   . Hypertension Father   . Prostate cancer Brother   . Obesity Brother   . Hyperthyroidism Brother   . Asthma Maternal Grandmother   . Hyperlipidemia Maternal Grandfather   . Hypertension Maternal Grandfather   . Stroke Maternal Grandfather   . COPD Paternal Grandfather   . Colon cancer Neg Hx   . Esophageal cancer Neg Hx   . Rectal cancer Neg Hx   . Stomach  cancer Neg Hx     Current Outpatient Medications  Medication Sig Dispense Refill  . b complex vitamins tablet Take 1 tablet by mouth daily.    . methimazole (TAPAZOLE) 10 MG tablet Take 1 tablet (10 mg total) by mouth 2 (two) times daily. 60 tablet 5   No current facility-administered medications for this visit.    No Known Allergies  REVIEW OF SYSTEMS (negative unless checked):   Cardiac:  []  Chest pain or chest pressure? []  Shortness of breath upon activity? []  Shortness of breath when lying flat? []  Irregular heart rhythm?  Vascular:  []  Pain in calf, thigh, or hip brought on by walking? []  Pain in feet at night that wakes you up from your sleep? []  Blood clot in your veins? [x]  Leg swelling?  Pulmonary:  []  Oxygen at home? []  Productive cough? []  Wheezing?  Neurologic:  []  Sudden weakness in arms or legs? []  Sudden numbness in arms or legs? []  Sudden onset of difficult speaking or slurred speech? []  Temporary loss of vision in one eye? []  Problems with dizziness?  Gastrointestinal:  []  Blood in stool? []  Vomited blood?  Genitourinary:  []  Burning when urinating? []  Blood in urine?  Psychiatric:  []  Major depression  Hematologic:  []  Bleeding problems? []  Problems with blood clotting?  Dermatologic:  []  Rashes or ulcers?  Constitutional:  []  Fever or chills?  Ear/Nose/Throat:  []  Change in hearing? []  Nose bleeds? []  Sore throat?  Musculoskeletal:  []  Back pain? []  Joint pain? []  Muscle pain?   Physical Examination     Vitals:   04/05/20 1340  BP: 127/72  Pulse: 60  Resp: 20  Temp: 98.6 F (37 C)  TempSrc: Temporal  SpO2: 97%  Weight: 200 lb (90.7 kg)  Height: 6\' 3"  (1.905 m)   Body mass index is 25 kg/m.  General:  WDWN in NAD; vital signs documented above Gait: Normal HENT: WNL, normocephalic Pulmonary: normal non-labored breathing , without wheezing Cardiac: regular HR, without  Murmurs Vascular Exam/Pulses:  Right  Left  Radial 2+ (normal) 2+ (normal)  Femoral 2+ (normal) 2+ (normal)  Popliteal Not palpable Not palpable  DP 2+ (normal) 2+ (normal)  PT 2+ (normal) 2+ (normal)   Extremities: with varicose veins, with reticular veins, with edema of right ankle, without stasis pigmentation, without lipodermatosclerosis, without ulcers Musculoskeletal: no muscle wasting or atrophy  Neurologic: A&O X 3;  No focal weakness or paresthesias are detected Psychiatric:  The pt has Normal affect.  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 male who presents with: RLE chronic venous insufficiency with swelling. He does have deep and superficial venous reflux on duplex. 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 His veins are of adequate size to be a candidate for ablation.   Based on the patient's history and examination, I recommend compression, elevation and continued exercise  I discussed with the patient the use of her 20-30 mm thigh high compression stockings and need for 3 month trial of such.  The patient will follow up in 3 months with Dr. Edilia Bo  I have additionally scheduled him to have a screening AAA duplex due to his significant family history. This can be scheduled at the patients discretion. I will call the patient with the results  Thank you for allowing [place] to participate in this patient's care.   Graceann Congress, PA-C [facility] Office: [phone]  04/05/2020, 3:54 PM  Clinic MD: Dr. Chestine Spore

## 2020-04-07 ENCOUNTER — Other Ambulatory Visit: Payer: Self-pay

## 2020-04-07 DIAGNOSIS — I82811 Embolism and thrombosis of superficial veins of right lower extremities: Secondary | ICD-10-CM

## 2020-04-11 ENCOUNTER — Ambulatory Visit (HOSPITAL_COMMUNITY)
Admission: RE | Admit: 2020-04-11 | Discharge: 2020-04-11 | Disposition: A | Discharge status: Home / Self Care | Payer: Medicare HMO | Source: Ambulatory Visit | Attending: Family Medicine | Admitting: Family Medicine

## 2020-04-11 ENCOUNTER — Other Ambulatory Visit: Payer: Self-pay

## 2020-04-11 DIAGNOSIS — I82811 Embolism and thrombosis of superficial veins of right lower extremities: Secondary | ICD-10-CM | POA: Insufficient documentation

## 2020-04-21 ENCOUNTER — Other Ambulatory Visit: Payer: Self-pay

## 2020-04-21 ENCOUNTER — Ambulatory Visit (INDEPENDENT_AMBULATORY_CARE_PROVIDER_SITE_OTHER): Payer: Medicare HMO

## 2020-04-21 VITALS — BP 90/54 | HR 73 | Temp 98.7°F | Resp 20 | Wt 198.0 lb

## 2020-04-21 DIAGNOSIS — Z0000 Encounter for general adult medical examination without abnormal findings: Secondary | ICD-10-CM | POA: Diagnosis not present

## 2020-04-21 NOTE — Patient Instructions (Addendum)
Mr. Wesley Black , Thank you for taking time to come for your Medicare Wellness Visit. I appreciate your ongoing commitment to your health goals. Please review the following plan we discussed and let me know if I can assist you in the future.   Screening recommendations/referrals: Colonoscopy: Done 03/24/19 Recommended yearly ophthalmology/optometry visit for glaucoma screening and checkup Recommended yearly dental visit for hygiene and checkup  Vaccinations: Influenza vaccine: Done 02/16/20 Pneumococcal vaccine: Up to date Tdap vaccine: Up to date Shingles vaccine: Shingrix discussed. Please contact your pharmacy for coverage information.    Covid-19: Moderna 1/26 & 06/09/19 and booster Pfizer 02/1920  Advanced directives: Please bring a copy of your health care power of attorney and living will to the office at your convenience.  Conditions/risks identified: Continue healthy living  Next appointment: Follow up in one year for your annual wellness visit.   Preventive Care 68 Years and Older, Male Preventive care refers to lifestyle choices and visits with your health care provider that can promote health and wellness. What does preventive care include?  A yearly physical exam. This is also called an annual well check.  Dental exams once or twice a year.  Routine eye exams. Ask your health care provider how often you should have your eyes checked.  Personal lifestyle choices, including:  Daily care of your teeth and gums.  Regular physical activity.  Eating a healthy diet.  Avoiding tobacco and drug use.  Limiting alcohol use.  Practicing safe sex.  Taking low doses of aspirin every day.  Taking vitamin and mineral supplements as recommended by your health care provider. What happens during an annual well check? The services and screenings done by your health care provider during your annual well check will depend on your age, overall health, lifestyle risk factors, and family  history of disease. Counseling  Your health care provider may ask you questions about your:  Alcohol use.  Tobacco use.  Drug use.  Emotional well-being.  Home and relationship well-being.  Sexual activity.  Eating habits.  History of falls.  Memory and ability to understand (cognition).  Work and work Astronomer. Screening  You may have the following tests or measurements:  Height, weight, and BMI.  Blood pressure.  Lipid and cholesterol levels. These may be checked every 5 years, or more frequently if you are over 68 years old.  Skin check.  Lung cancer screening. You may have this screening every year starting at age 68 if you have a 30-pack-year history of smoking and currently smoke or have quit within the past 15 years.  Fecal occult blood test (FOBT) of the stool. You may have this test every year starting at age 68.  Flexible sigmoidoscopy or colonoscopy. You may have a sigmoidoscopy every 5 years or a colonoscopy every 10 years starting at age 68.  Prostate cancer screening. Recommendations will vary depending on your family history and other risks.  Hepatitis C blood test.  Hepatitis B blood test.  Sexually transmitted disease (STD) testing.  Diabetes screening. This is done by checking your blood sugar (glucose) after you have not eaten for a while (fasting). You may have this done every 1-3 years.  Abdominal aortic aneurysm (AAA) screening. You may need this if you are a current or former smoker.  Osteoporosis. You may be screened starting at age 35 if you are at high risk. Talk with your health care provider about your test results, treatment options, and if necessary, the need for more tests. Vaccines  Your health care provider may recommend certain vaccines, such as:  Influenza vaccine. This is recommended every year.  Tetanus, diphtheria, and acellular pertussis (Tdap, Td) vaccine. You may need a Td booster every 10 years.  Zoster vaccine.  You may need this after age 24.  Pneumococcal 13-valent conjugate (PCV13) vaccine. One dose is recommended after age 26.  Pneumococcal polysaccharide (PPSV23) vaccine. One dose is recommended after age 68. Talk to your health care provider about which screenings and vaccines you need and how often you need them. This information is not intended to replace advice given to you by your health care provider. Make sure you discuss any questions you have with your health care provider. Document Released: 04/29/2015 Document Revised: 12/21/2015 Document Reviewed: 02/01/2015 Elsevier Interactive Patient Education  2017 [place] Prevention in the Home Falls can cause injuries. They can happen to people of all ages. There are many things you can do to make your home safe and to help prevent falls. What can I do on the outside of my home?  Regularly fix the edges of walkways and driveways and fix any cracks.  Remove anything that might make you trip as you walk through a door, such as a raised step or threshold.  Trim any bushes or trees on the path to your home.  Use bright outdoor lighting.  Clear any walking paths of anything that might make someone trip, such as rocks or tools.  Regularly check to see if handrails are loose or broken. Make sure that both sides of any steps have handrails.  Any raised decks and porches should have guardrails on the edges.  Have any leaves, snow, or ice cleared regularly.  Use sand or salt on walking paths during winter.  Clean up any spills in your garage right away. This includes oil or grease spills. What can I do in the bathroom?  Use night lights.  Install grab bars by the toilet and in the tub and shower. Do not use towel bars as grab bars.  Use non-skid mats or decals in the tub or shower.  If you need to sit down in the shower, use a plastic, non-slip stool.  Keep the floor dry. Clean up any water that spills on the floor as soon  as it happens.  Remove soap buildup in the tub or shower regularly.  Attach bath mats securely with double-sided non-slip rug tape.  Do not have throw rugs and other things on the floor that can make you trip. What can I do in the bedroom?  Use night lights.  Make sure that you have a light by your bed that is easy to reach.  Do not use any sheets or blankets that are too big for your bed. They should not hang down onto the floor.  Have a firm chair that has side arms. You can use this for support while you get dressed.  Do not have throw rugs and other things on the floor that can make you trip. What can I do in the kitchen?  Clean up any spills right away.  Avoid walking on wet floors.  Keep items that you use a lot in easy-to-reach places.  If you need to reach something above you, use a strong step stool that has a grab bar.  Keep electrical cords out of the way.  Do not use floor polish or wax that makes floors slippery. If you must use wax, use non-skid floor wax.  Do  not have throw rugs and other things on the floor that can make you trip. What can I do with my stairs?  Do not leave any items on the stairs.  Make sure that there are handrails on both sides of the stairs and use them. Fix handrails that are broken or loose. Make sure that handrails are as long as the stairways.  Check any carpeting to make sure that it is firmly attached to the stairs. Fix any carpet that is loose or worn.  Avoid having throw rugs at the top or bottom of the stairs. If you do have throw rugs, attach them to the floor with carpet tape.  Make sure that you have a light switch at the top of the stairs and the bottom of the stairs. If you do not have them, ask someone to add them for you. What else can I do to help prevent falls?  Wear shoes that:  Do not have high heels.  Have rubber bottoms.  Are comfortable and fit you well.  Are closed at the toe. Do not wear sandals.  If  you use a stepladder:  Make sure that it is fully opened. Do not climb a closed stepladder.  Make sure that both sides of the stepladder are locked into place.  Ask someone to hold it for you, if possible.  Clearly mark and make sure that you can see:  Any grab bars or handrails.  First and last steps.  Where the edge of each step is.  Use tools that help you move around (mobility aids) if they are needed. These include:  Canes.  Walkers.  Scooters.  Crutches.  Turn on the lights when you go into a dark area. Replace any light bulbs as soon as they burn out.  Set up your furniture so you have a clear path. Avoid moving your furniture around.  If any of your floors are uneven, fix them.  If there are any pets around you, be aware of where they are.  Review your medicines with your doctor. Some medicines can make you feel dizzy. This can increase your chance of falling. Ask your doctor what other things that you can do to help prevent falls. This information is not intended to replace advice given to you by your health care provider. Make sure you discuss any questions you have with your health care provider. Document Released: 01/27/2009 Document Revised: 09/08/2015 Document Reviewed: 05/07/2014 Elsevier Interactive Patient Education  2017 [employer].

## 2020-04-21 NOTE — Progress Notes (Signed)
Subjective:   Wesley Black is a 70 y.o. male who presents for Medicare Annual/Subsequent preventive examination.  Review of Systems     Cardiac Risk Factors include: advanced age (>60men, >41 women);male gender     Objective:    Today's Vitals   04/21/20 1252  BP: (!) 90/54  Pulse: 73  Resp: 20  Temp: 98.7 F (37.1 C)  SpO2: 98%  Weight: 198 lb (89.8 kg)   Body mass index is 24.75 kg/m.  Advanced Directives 04/21/2020 03/06/2019  Does Patient Have a Medical Advance Directive? Yes Yes  Type of Paramedic of [place];Living will -  Copy of [place] in Chart? No - copy requested -    Current Medications (verified) Outpatient Encounter Medications as of 04/21/2020  Medication Sig  . b complex vitamins tablet Take 1 tablet by mouth daily.  . methimazole (TAPAZOLE) 10 MG tablet Take 1 tablet (10 mg total) by mouth 2 (two) times daily.   No facility-administered encounter medications on file as of 04/21/2020.    Allergies (verified) Patient has no known allergies.   History: Past Medical History:  Diagnosis Date  . Fracture    Multiple   Past Surgical History:  Procedure Laterality Date  . COLONOSCOPY    . KNEE ARTHROPLASTY Right 2004  . LASIK Bilateral 2001  . SHOULDER SURGERY Left 1984   Family History  Problem Relation Age of Onset  . Hypertension Mother   . Arthritis Mother   . Hyperlipidemia Mother   . Prostate cancer Father   . Cancer Father   . Hyperlipidemia Father   . Hypertension Father   . Prostate cancer Brother   . Obesity Brother   . Hyperthyroidism Brother   . Asthma Maternal Grandmother   . Hyperlipidemia Maternal Grandfather   . Hypertension Maternal Grandfather   . Stroke Maternal Grandfather   . COPD Paternal Grandfather   . Colon cancer Neg Hx   . Esophageal cancer Neg Hx   . Rectal cancer Neg Hx   . Stomach cancer Neg Hx    Social History   Socioeconomic History  . Marital status:  Single    Spouse name: Not on file  . Number of children: 0  . Years of education: Master's  . Highest education level: Not on file  Occupational History  . Occupation: Retired  Tobacco Use  . Smoking status: Never Smoker  . Smokeless tobacco: Never Used  Vaping Use  . Vaping Use: Never used  Substance and Sexual Activity  . Alcohol use: Yes    Comment: 2 glasses of wine or beer per month  . Drug use: No  . Sexual activity: Not on file    Comment: Married  Other Topics Concern  . Not on file  Social History Narrative   Lives at home, separated   Left-handed   Caffeine: 1 glass of tea per day, occasional 7 oz Pepsi (every 2 wks)      Social Determinants of Health   Financial Resource Strain: Low Risk   . Difficulty of Paying Living Expenses: Not hard at all  Food Insecurity: No Food Insecurity  . Worried About Charity fundraiser in the Last Year: Never true  . Ran Out of Food in the Last Year: Never true  Transportation Needs: No Transportation Needs  . Lack of Transportation (Medical): No  . Lack of Transportation (Non-Medical): No  Physical Activity: Sufficiently Active  . Days of Exercise per Week:  3 days  . Minutes of Exercise per Session: 120 min  Stress: No Stress Concern Present  . Feeling of Stress : Not at all  Social Connections: Moderately Isolated  . Frequency of Communication with Friends and Family: Once a week  . Frequency of Social Gatherings with Friends and Family: Once a week  . Attends Religious Services: 1 to 4 times per year  . Active Member of Clubs or Organizations: Yes  . Attends Banker Meetings: 1 to 4 times per year  . Marital Status: Never married    Tobacco Counseling Counseling given: Not Answered   Clinical Intake:  Pre-visit preparation completed: Yes  Pain : No/denies pain     BMI - recorded: 24.75 Nutritional Status: BMI of 19-24  Normal Nutritional Risks: None Diabetes: No  How often do you need to  have someone help you when you read instructions, pamphlets, or other written materials from your doctor or pharmacy?: 1 - Never  Diabetic?No  Interpreter Needed?: No  Information entered by :: Lanier Ensign, LPN   Activities of Daily Living In your present state of health, do you have any difficulty performing the following activities: 04/21/2020  Hearing? Y  Comment tinitus and left ear injur and mild loss to right ear  Vision? N  Difficulty concentrating or making decisions? N  Walking or climbing stairs? N  Dressing or bathing? N  Doing errands, shopping? N  Preparing Food and eating ? N  Using the Toilet? N  In the past six months, have you accidently leaked urine? N  Do you have problems with loss of bowel control? N  Managing your Medications? N  Managing your Finances? N  Housekeeping or managing your Housekeeping? N  Some recent data might be hidden    Patient Care Team: Ardith Dark, MD as PCP - General (Family Medicine) Jethro Bastos, MD as Consulting Physician (Family Medicine) Etta Quill, MD as Attending Physician (Ophthalmology) Etta Quill, MD as Attending Physician (Ophthalmology)  Indicate any recent Medical Services you may have received from other than [facility] providers in the past year (date may be approximate).     Assessment:   This is a routine wellness examination for Wesley Black.  Hearing/Vision screen  Hearing Screening   125Hz  250Hz  500Hz  1000Hz  2000Hz  3000Hz  4000Hz  6000Hz  8000Hz   Right ear:           Left ear:           Comments: Loss high frequency in right ear and injury to left ear and have tinnitus  Vision Screening Comments: Pt follows Dr Heather for eye exams   Dietary issues and exercise activities discussed: Current Exercise Habits: Home exercise routine, Time (Minutes): > 60, Frequency (Times/Week): 4, Weekly Exercise (Minutes/Week): 0, Intensity: Intense (cycling)  Goals    . Patient Stated     Continue on   regimen of good health and exercise      Depression Screen PHQ 2/9 Scores 04/21/2020 03/06/2019 11/18/2018 04/14/2018  PHQ - 2 Score 1 0 1 0  PHQ- 9 Score - - 1 -    Fall Risk Fall Risk  04/21/2020 03/06/2019  Falls in the past year? 0 0  Number falls in past yr: 0 -  Injury with Fall? 0 0  Follow up Falls prevention discussed Education provided;Falls prevention discussed;Falls evaluation completed    FALL RISK PREVENTION PERTAINING TO THE HOME:  Any stairs in or around the home? Yes  If so, are  there any without handrails? No  Home free of loose throw rugs in walkways, pet beds, electrical cords, etc? Yes  Adequate lighting in your home to reduce risk of falls? Yes   ASSISTIVE DEVICES UTILIZED TO PREVENT FALLS:  Life alert? No  Use of a cane, walker or w/c? No  Grab bars in the bathroom? No  Shower chair or bench in shower? No  Elevated toilet seat or a handicapped toilet? Yes   TIMED UP AND GO:  Was the test performed? Yes .  Length of time to ambulate 10 feet: 10 sec.   Gait steady and fast without use of assistive device  Cognitive Function:     6CIT Screen 04/21/2020  What Year? 0 points  What month? 0 points  Count back from 20 0 points  Months in reverse 0 points  Repeat phrase 0 points    Immunizations Immunization History  Administered Date(s) Administered  . Fluad Quad(high Dose 65+) 03/06/2019, 02/16/2020  . Influenza,inj,Quad PF,6+ Mos 12/12/2017  . Moderna Sars-Covid-2 Vaccination 05/12/2019, 06/09/2019  . PFIZER SARS-COV-2 Vaccination 03/04/2020  . Pneumococcal Conjugate-13 12/12/2017  . Pneumococcal Polysaccharide-23 03/06/2019  . Tdap 11/18/2018    TDAP status: Up to date  Flu Vaccine status: Up to date Done 02/16/20 Pneumococcal vaccine status: Up to date  Covid-19 vaccine status: Completed vaccines  Qualifies for Shingles Vaccine? Yes   Zostavax completed No   Shingrix Completed?: No.    Education has been provided regarding the  importance of this vaccine. Patient has been advised to call insurance company to determine out of pocket expense if they have not yet received this vaccine. Advised may also receive vaccine at local pharmacy or Health Dept. Verbalized acceptance and understanding.  Screening Tests Health Maintenance  Topic Date Due  . TETANUS/TDAP  11/17/2028  . COLONOSCOPY (Pts 45-32yrs Insurance coverage will need to be confirmed)  03/23/2029  . INFLUENZA VACCINE  Completed  . COVID-19 Vaccine  Completed  . Hepatitis C Screening  Completed  . PNA vac Low Risk Adult  Completed    Health Maintenance  There are no preventive care reminders to display for this patient.  Colorectal cancer screening: Type of screening: Colonoscopy. Completed 03/24/19. Repeat every 10 years   Additional Screening:  Hepatitis C Screening: Completed 11/18/18  Vision Screening: Recommended annual ophthalmology exams for early detection of glaucoma and other disorders of the eye. Is the patient up to date with their annual eye exam?  Yes  Who is the provider or what is the name of the office in which the patient attends annual eye exams? Dr Katherine [place]   Dental Screening: Recommended annual dental exams for proper oral hygiene  Community Resource Referral / Chronic Care Management: CRR required this visit?  No   CCM required this visit?  No      Plan:     I have personally reviewed and noted the following in the patient's chart:   . Medical and social history . Use of alcohol, tobacco or illicit drugs  . Current medications and supplements . Functional ability and status . Nutritional status . Physical activity . Advanced directives . List of other physicians . Hospitalizations, surgeries, and ER visits in previous 12 months . Vitals . Screenings to include cognitive, depression, and falls . Referrals and appointments  In addition, I have reviewed and discussed with patient certain preventive protocols,  quality metrics, and best practice recommendations. A written personalized care plan for preventive services as well as general  preventive health recommendations were provided to patient.     Marzella Schlein, LPN   09/19/628   Nurse Notes: None

## 2020-05-16 ENCOUNTER — Other Ambulatory Visit: Payer: Self-pay

## 2020-05-18 ENCOUNTER — Ambulatory Visit: Payer: Medicare HMO | Admitting: Endocrinology

## 2020-05-18 ENCOUNTER — Other Ambulatory Visit: Payer: Self-pay

## 2020-05-18 VITALS — BP 110/80 | HR 57 | Ht 75.0 in | Wt 206.4 lb

## 2020-05-18 DIAGNOSIS — E0590 Thyrotoxicosis, unspecified without thyrotoxic crisis or storm: Secondary | ICD-10-CM | POA: Diagnosis not present

## 2020-05-18 LAB — TSH: TSH: 31.87 u[IU]/mL — ABNORMAL HIGH (ref 0.35–4.50)

## 2020-05-18 LAB — T4, FREE: Free T4: 0.56 ng/dL — ABNORMAL LOW (ref 0.60–1.60)

## 2020-05-18 NOTE — Patient Instructions (Signed)
Blood tests are requested for you today.  We'll let you know about the results.   If ever you have fever while taking methimazole, stop it and call us, even if the reason is obvious, because of the risk of a rare side-effect.  It is best to never miss the medication.  However, if you do miss it, next best is to double up the next time.   Please come back for a follow-up appointment in 4 months.   

## 2020-05-18 NOTE — Progress Notes (Signed)
Subjective:    Patient ID: Wesley Black, male    DOB: 09-07-1950, 70 y.o.   MRN: [id number]  HPI Pt returns for f/u of hyperthyroidism (dx'ed 2021; he chose tapazole rx; he has never had thyroid imaging).  pt states he feels well in general.  He does not miss the tapazole.  Past Medical History:  Diagnosis Date  . Fracture    Multiple    Past Surgical History:  Procedure Laterality Date  . COLONOSCOPY    . KNEE ARTHROPLASTY Right 2004  . LASIK Bilateral 2001  . SHOULDER SURGERY Left 1984    Social History   Socioeconomic History  . Marital status: Single    Spouse name: Not on file  . Number of children: 0  . Years of education: Master's  . Highest education level: Not on file  Occupational History  . Occupation: Retired  Tobacco Use  . Smoking status: Never Smoker  . Smokeless tobacco: Never Used  Vaping Use  . Vaping Use: Never used  Substance and Sexual Activity  . Alcohol use: Yes    Comment: 2 glasses of wine or beer per month  . Drug use: No  . Sexual activity: Not on file    Comment: Married  Other Topics Concern  . Not on file  Social History Narrative   Lives at home, separated   Left-handed   Caffeine: 1 glass of tea per day, occasional 7 oz Pepsi (every 2 wks)      Social Determinants of Health   Financial Resource Strain: Low Risk   . Difficulty of Paying Living Expenses: Not hard at all  Food Insecurity: No Food Insecurity  . Worried About Programme researcher, broadcasting/film/video in the Last Year: Never true  . Ran Out of Food in the Last Year: Never true  Transportation Needs: No Transportation Needs  . Lack of Transportation (Medical): No  . Lack of Transportation (Non-Medical): No  Physical Activity: Sufficiently Active  . Days of Exercise per Week: 3 days  . Minutes of Exercise per Session: 120 min  Stress: No Stress Concern Present  . Feeling of Stress : Not at all  Social Connections: Moderately Isolated  . Frequency of Communication with Friends and  Family: Once a week  . Frequency of Social Gatherings with Friends and Family: Once a week  . Attends Religious Services: 1 to 4 times per year  . Active Member of Clubs or Organizations: Yes  . Attends Banker Meetings: 1 to 4 times per year  . Marital Status: Never married  Intimate Partner Violence: Not At Risk  . Fear of Current or Ex-Partner: No  . Emotionally Abused: No  . Physically Abused: No  . Sexually Abused: No    Current Outpatient Medications on File Prior to Visit  Medication Sig Dispense Refill  . b complex vitamins tablet Take 1 tablet by mouth daily.     No current facility-administered medications on file prior to visit.    No Known Allergies  Family History  Problem Relation Age of Onset  . Hypertension Mother   . Arthritis Mother   . Hyperlipidemia Mother   . Prostate cancer Father   . Cancer Father   . Hyperlipidemia Father   . Hypertension Father   . Prostate cancer Brother   . Obesity Brother   . Hyperthyroidism Brother   . Asthma Maternal Grandmother   . Hyperlipidemia Maternal Grandfather   . Hypertension Maternal Grandfather   . Stroke  Maternal Grandfather   . COPD Paternal Grandfather   . Colon cancer Neg Hx   . Esophageal cancer Neg Hx   . Rectal cancer Neg Hx   . Stomach cancer Neg Hx     BP 110/80 (BP Location: Right Arm, Patient Position: Sitting, Cuff Size: Normal)   Pulse (!) 57   Ht 6\' 3"  (1.905 m)   Wt 206 lb 6.4 oz (93.6 kg)   SpO2 97%   BMI 25.80 kg/m    Review of Systems Denies fever.      Objective:   Physical Exam VITAL SIGNS:  See vs page GENERAL: no distress NECK: Thyroid is slightly and diffusely enlarged  No thyroid nodule is palpable.  No palpable lymphadenopathy at the anterior neck.     Lab Results  Component Value Date   TSH 31.87 (H) 05/18/2020      Assessment & Plan:  Hyperthyroidism, overcontrolled. D/c tapazole, and recheck TFT in 1-2 weeks.

## 2020-05-19 ENCOUNTER — Encounter: Payer: Self-pay | Admitting: Endocrinology

## 2020-05-25 ENCOUNTER — Other Ambulatory Visit: Payer: Medicare HMO

## 2020-05-26 ENCOUNTER — Other Ambulatory Visit (INDEPENDENT_AMBULATORY_CARE_PROVIDER_SITE_OTHER): Payer: Medicare HMO

## 2020-05-26 ENCOUNTER — Other Ambulatory Visit: Payer: Self-pay | Admitting: Endocrinology

## 2020-05-26 ENCOUNTER — Other Ambulatory Visit: Payer: Self-pay

## 2020-05-26 DIAGNOSIS — E0590 Thyrotoxicosis, unspecified without thyrotoxic crisis or storm: Secondary | ICD-10-CM | POA: Diagnosis not present

## 2020-05-26 LAB — TSH: TSH: 6.7 u[IU]/mL — ABNORMAL HIGH (ref 0.35–4.50)

## 2020-05-26 LAB — T4, FREE: Free T4: 0.79 ng/dL (ref 0.60–1.60)

## 2020-06-03 ENCOUNTER — Other Ambulatory Visit: Payer: Self-pay

## 2020-06-03 ENCOUNTER — Other Ambulatory Visit (INDEPENDENT_AMBULATORY_CARE_PROVIDER_SITE_OTHER): Payer: Medicare HMO

## 2020-06-03 DIAGNOSIS — E0590 Thyrotoxicosis, unspecified without thyrotoxic crisis or storm: Secondary | ICD-10-CM | POA: Diagnosis not present

## 2020-06-03 LAB — T4, FREE: Free T4: 0.92 ng/dL (ref 0.60–1.60)

## 2020-06-03 LAB — TSH: TSH: 4.06 u[IU]/mL (ref 0.35–4.50)

## 2020-06-07 ENCOUNTER — Ambulatory Visit: Payer: Medicare HMO | Admitting: Endocrinology

## 2020-06-07 ENCOUNTER — Other Ambulatory Visit: Payer: Self-pay

## 2020-06-07 VITALS — BP 130/64 | HR 71 | Ht 75.0 in | Wt 206.4 lb

## 2020-06-07 DIAGNOSIS — E0590 Thyrotoxicosis, unspecified without thyrotoxic crisis or storm: Secondary | ICD-10-CM

## 2020-06-07 MED ORDER — METHIMAZOLE 5 MG PO TABS: 5.0000 mg | ORAL_TABLET | Freq: Every day | ORAL | 3 refills | Status: DC

## 2020-06-07 NOTE — Progress Notes (Signed)
Subjective:    Patient ID: Wesley Black, male    DOB: [birth year], 70 y.o.   MRN: [id number]  HPI Pt returns for f/u of hyperthyroidism (dx'ed 2021; he chose tapazole rx; he has never had thyroid imaging; tapazole was stopped 1/22, due to hypothyroidism).  pt states he feels well in general.   Past Medical History:  Diagnosis Date  . Fracture    Multiple    Past Surgical History:  Procedure Laterality Date  . COLONOSCOPY    . KNEE ARTHROPLASTY Right 2004  . LASIK Bilateral 2001  . SHOULDER SURGERY Left 1984    Social History   Socioeconomic History  . Marital status: Single    Spouse name: Not on file  . Number of children: 0  . Years of education: Master's  . Highest education level: Not on file  Occupational History  . Occupation: Retired  Tobacco Use  . Smoking status: Never Smoker  . Smokeless tobacco: Never Used  Vaping Use  . Vaping Use: Never used  Substance and Sexual Activity  . Alcohol use: Yes    Comment: 2 glasses of wine or beer per month  . Drug use: No  . Sexual activity: Not on file    Comment: Married  Other Topics Concern  . Not on file  Social History Narrative   Lives at home, separated   Left-handed   Caffeine: 1 glass of tea per day, occasional 7 oz Pepsi (every 2 wks)      Social Determinants of Health   Financial Resource Strain: Low Risk   . Difficulty of Paying Living Expenses: Not hard at all  Food Insecurity: No Food Insecurity  . Worried About Programme researcher, broadcasting/film/video in the Last Year: Never true  . Ran Out of Food in the Last Year: Never true  Transportation Needs: No Transportation Needs  . Lack of Transportation (Medical): No  . Lack of Transportation (Non-Medical): No  Physical Activity: Sufficiently Active  . Days of Exercise per Week: 3 days  . Minutes of Exercise per Session: 120 min  Stress: No Stress Concern Present  . Feeling of Stress : Not at all  Social Connections: Moderately Isolated  . Frequency of  Communication with Friends and Family: Once a week  . Frequency of Social Gatherings with Friends and Family: Once a week  . Attends Religious Services: 1 to 4 times per year  . Active Member of Clubs or Organizations: Yes  . Attends Banker Meetings: 1 to 4 times per year  . Marital Status: Never married  Intimate Partner Violence: Not At Risk  . Fear of Current or Ex-Partner: No  . Emotionally Abused: No  . Physically Abused: No  . Sexually Abused: No    Current Outpatient Medications on File Prior to Visit  Medication Sig Dispense Refill  . b complex vitamins tablet Take 1 tablet by mouth daily.     No current facility-administered medications on file prior to visit.    No Known Allergies  Family History  Problem Relation Age of Onset  . Hypertension Mother   . Arthritis Mother   . Hyperlipidemia Mother   . Prostate cancer Father   . Cancer Father   . Hyperlipidemia Father   . Hypertension Father   . Prostate cancer Brother   . Obesity Brother   . Hyperthyroidism Brother   . Asthma Maternal Grandmother   . Hyperlipidemia Maternal Grandfather   . Hypertension Maternal Grandfather   .  Stroke Maternal Grandfather   . COPD Paternal Grandfather   . Colon cancer Neg Hx   . Esophageal cancer Neg Hx   . Rectal cancer Neg Hx   . Stomach cancer Neg Hx     BP 130/64 (BP Location: Right Arm, Patient Position: Sitting, Cuff Size: Normal)   Pulse 71   Ht 6\' 3"  (1.905 m)   Wt 206 lb 6.4 oz (93.6 kg)   SpO2 96%   BMI 25.80 kg/m    Review of Systems     Objective:   Physical Exam VITAL SIGNS:  See vs page GENERAL: no distress NECK: There is no palpable thyroid enlargement.  No thyroid nodule is palpable.  No palpable lymphadenopathy at the anterior neck.   Lab Results  Component Value Date   TSH 4.06 06/03/2020      Assessment & Plan:  Hyperthyroidism.  This will recur soon if he does not resume tapazole.   Patient Instructions  Please resume  the methimazole at 5 mg per day. If ever you have fever while taking methimazole, stop it and call 06/05/2020, even if the reason is obvious, because of the risk of a rare side-effect. It is best to never miss the medication.  However, if you do miss it, next best is to double up the next time.   Please redo the blood tests in 2-3 weeks Please come back for a follow-up appointment in 2 months.

## 2020-06-07 NOTE — Patient Instructions (Signed)
Please resume the methimazole at 5 mg per day. If ever you have fever while taking methimazole, stop it and call us, even if the reason is obvious, because of the risk of a rare side-effect. It is best to never miss the medication.  However, if you do miss it, next best is to double up the next time.   Please redo the blood tests in 2-3 weeks Please come back for a follow-up appointment in 2 months.

## 2020-06-21 ENCOUNTER — Other Ambulatory Visit (INDEPENDENT_AMBULATORY_CARE_PROVIDER_SITE_OTHER): Payer: Medicare HMO

## 2020-06-21 ENCOUNTER — Other Ambulatory Visit: Payer: Self-pay

## 2020-06-21 DIAGNOSIS — E0590 Thyrotoxicosis, unspecified without thyrotoxic crisis or storm: Secondary | ICD-10-CM

## 2020-06-21 LAB — TSH: TSH: 3.11 u[IU]/mL (ref 0.35–4.50)

## 2020-06-21 LAB — T4, FREE: Free T4: 0.9 ng/dL (ref 0.60–1.60)

## 2020-06-26 ENCOUNTER — Other Ambulatory Visit: Payer: Self-pay | Admitting: Endocrinology

## 2020-07-05 DIAGNOSIS — N402 Nodular prostate without lower urinary tract symptoms: Secondary | ICD-10-CM | POA: Diagnosis not present

## 2020-07-06 ENCOUNTER — Encounter: Payer: Self-pay | Admitting: Vascular Surgery

## 2020-07-06 ENCOUNTER — Ambulatory Visit: Payer: Medicare HMO | Admitting: Vascular Surgery

## 2020-07-06 ENCOUNTER — Other Ambulatory Visit: Payer: Self-pay

## 2020-07-06 VITALS — BP 103/67 | HR 66 | Temp 98.2°F | Resp 20 | Ht 75.0 in | Wt 202.0 lb

## 2020-07-06 DIAGNOSIS — I83811 Varicose veins of right lower extremities with pain: Secondary | ICD-10-CM

## 2020-07-06 DIAGNOSIS — I82811 Embolism and thrombosis of superficial veins of right lower extremities: Secondary | ICD-10-CM

## 2020-07-06 NOTE — Progress Notes (Signed)
REASON FOR VISIT:   Follow-up of chronic venous insufficiency  MEDICAL ISSUES:   CHRONIC VENOUS INSUFFICIENCY: This patient has significant deep venous reflux and superficial venous reflux bilaterally.  He has CEAP C3 venous disease.  He may have some component of lymphedema given his hip fracture and ankle injury on the right.  However I think that is superficial and deep venous reflux is contributing to his swelling.  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  We have discussed the importance of intermittent leg elevation the proper positioning for this.  He has been doing this.  He is also been wearing his thigh-high compression stockings which help.  I have encouraged him to avoid prolonged sitting and standing.  We also discussed the importance of exercise.  He is very active.  He also has a healthy weight.  I think he could potentially benefit from laser ablation of the right great saphenous vein with less than 10 stabs.  We have discussed this in detail today.  I have explained that I do not think his symptoms or swelling will be completely resolved as there are other issues involved and his symptoms are somewhat atypical as he describes mostly weakness in the right leg with exercise.  I have discussed the indications for endovenous laser ablation of the R GSV, that is to lower the pressure in the veins and potentially help relieve the symptoms from venous hypertension. I have also discussed alternative options including conservative treatment with leg elevation, compression therapy, exercise, avoiding prolonged sitting and standing, and weight management. I have discussed the potential complications of the procedure, including, but not limited to: bleeding, bruising, leg swelling, nerve injury, skin burns, significant pain from phlebitis, deep venous thrombosis, or failure of the vein to close.  I have also explained that venous insufficiency is a chronic disease,  and that the patient is at risk for recurrent varicose veins in the future.  All of the patient's questions were encouraged and answered. They are agreeable to proceed.   Of note he has a hard time externally rotating his hip which will make the procedure more technically challenging but I think by rotating into the right will be able to adequately visualize the saphenous vein over its entire length.  I have discussed with the patient the indications for stab phlebectomy.  I have explained to the patient that that will have small scars from the stab incisions.  I explained that the other risks include leg swelling, bruising, bleeding, and phlebitis.  All the patient's questions were encouraged and answered and they are agreeable to proceed.     HPI:   Wesley Black is a pleasant 70 y.o. male who was seen by Graceann Congress, PA on 04/05/2020 with right lower extremity swelling and varicose veins.  On exam, he had large varicose veins, reticular veins, and some edema at the ankle.  In addition he had hyperpigmentation with lipodermatosclerosis.  Noninvasive study showed reflux in the right great saphenous vein from the saphenofemoral junction to the proximal calf.  Diameters of the vein ranged from 0.59-0.77 cm.  In addition he had deep venous reflux.  He was encouraged to wear thigh-high compression stockings with a gradient of 20 to 30 mmHg, elevate his legs and exercise.  He comes in for 47-month follow-up visit.  On my history the patient describes some heaviness in the right leg which is proved with exercise.  He also describes some weakness  in the right leg with exercise.  He denies any back pain.  He has no real risk factors for peripheral vascular disease.  With respect to his venous disease he had a DVT in the right leg in 2001.  He has had no previous venous procedures.  He has been wearing his thigh-high compression stockings which do help his symptoms some.  Leg elevation also helps his  symptoms.  He did have a significant injury that involved twisting of his lower right leg many years ago and has had some chronic swelling since that time.  He is also fallen and broken his right hip and has had surgery on his right hip.  Past Medical History:  Diagnosis Date  . DVT (deep venous thrombosis) (HCC)   . Fracture    Multiple    Family History  Problem Relation Age of Onset  . Hypertension Mother   . Arthritis Mother   . Hyperlipidemia Mother   . Prostate cancer Father   . Cancer Father   . Hyperlipidemia Father   . Hypertension Father   . Prostate cancer Brother   . Obesity Brother   . Hyperthyroidism Brother   . Asthma Maternal Grandmother   . Hyperlipidemia Maternal Grandfather   . Hypertension Maternal Grandfather   . Stroke Maternal Grandfather   . COPD Paternal Grandfather   . Colon cancer Neg Hx   . Esophageal cancer Neg Hx   . Rectal cancer Neg Hx   . Stomach cancer Neg Hx     SOCIAL HISTORY: Social History   Tobacco Use  . Smoking status: Never Smoker  . Smokeless tobacco: Never Used  Substance Use Topics  . Alcohol use: Yes    Comment: 2 glasses of wine or beer per month    No Known Allergies  Current Outpatient Medications  Medication Sig Dispense Refill  . b complex vitamins tablet Take 1 tablet by mouth daily.    . methimazole (TAPAZOLE) 5 MG tablet Take 1 tablet (5 mg total) by mouth daily. 90 tablet 3   No current facility-administered medications for this visit.    REVIEW OF SYSTEMS:  [X]  denotes positive finding, [ ]  denotes negative finding Cardiac  Comments:  Chest pain or chest pressure:    Shortness of breath upon exertion:    Short of breath when lying flat:    Irregular heart rhythm:        Vascular    Pain in calf, thigh, or hip brought on by ambulation:    Pain in feet at night that wakes you up from your sleep:     Blood clot in your veins:    Leg swelling:  x       Pulmonary    Oxygen at home:    Productive  cough:     Wheezing:         Neurologic    Sudden weakness in arms or legs:     Sudden numbness in arms or legs:     Sudden onset of difficulty speaking or slurred speech:    Temporary loss of vision in one eye:     Problems with dizziness:         Gastrointestinal    Blood in stool:     Vomited blood:         Genitourinary    Burning when urinating:     Blood in urine:        Psychiatric    Major depression:  Hematologic    Bleeding problems:    Problems with blood clotting too easily:        Skin    Rashes or ulcers:        Constitutional    Fever or chills:     PHYSICAL EXAM:   Vitals:   07/06/20 0850  BP: 103/67  Pulse: 66  Resp: 20  Temp: 98.2 F (36.8 C)  SpO2: 98%  Weight: 91.6 kg  Height: 6\' 3"  (1.905 m)    GENERAL: The patient is a well-nourished male, in no acute distress. The vital signs are documented above. CARDIAC: There is a regular rate and rhythm.  VASCULAR: I do not detect carotid bruits. He has palpable femoral pulses and palpable posterior tibial pulses bilaterally. He has right lower extremity swelling. He has some dilated varicose veins in his medial right calf which are under elevated pressure.  He also has reticular veins and spider veins.    PULMONARY: There is good air exchange bilaterally without wheezing or rales. ABDOMEN: Soft and non-tender with normal pitched bowel sounds.  I do not palpate an aneurysm. MUSCULOSKELETAL: There are no major deformities or cyanosis. NEUROLOGIC: No focal weakness or paresthesias are detected. SKIN: There are no ulcers or rashes noted. PSYCHIATRIC: The patient has a normal affect.  DATA:    VENOUS DUPLEX: I have reviewed his venous duplex scan that was done on 04/05/2021.  This was of the right lower extremity only.  He had no evidence of DVT.  There was some chronic superficial venous thrombosis in the right small saphenous vein.  He had deep venous reflux involving the femoral vein  and popliteal vein.  He had superficial venous reflux in the right great saphenous vein from the saphenofemoral junction to the proximal calf.  The diameters of the vein ranged from 0.59-0.77 cm.  DUPLEX ABDOMINAL AORTA: He had a duplex of his abdominal aorta on 04/13/2020 which showed that he did not have an aneurysm.  The maximum diameter of his aorta was 3.6 cm.   A total of 45 minutes was spent on this visit. 25 minutes was face to face time. More than 50% of the time was spent on counseling and coordinating with the patient.    04/15/2020 [facility] [phone]

## 2020-07-07 ENCOUNTER — Other Ambulatory Visit: Payer: Self-pay | Admitting: *Deleted

## 2020-07-07 DIAGNOSIS — I83811 Varicose veins of right lower extremities with pain: Secondary | ICD-10-CM

## 2020-07-19 ENCOUNTER — Other Ambulatory Visit: Payer: Self-pay | Admitting: *Deleted

## 2020-07-19 MED ORDER — LORAZEPAM 1 MG PO TABS: ORAL_TABLET | ORAL | 0 refills | Status: DC

## 2020-07-22 DIAGNOSIS — N402 Nodular prostate without lower urinary tract symptoms: Secondary | ICD-10-CM | POA: Diagnosis not present

## 2020-07-22 DIAGNOSIS — N5201 Erectile dysfunction due to arterial insufficiency: Secondary | ICD-10-CM | POA: Diagnosis not present

## 2020-07-28 ENCOUNTER — Encounter: Payer: Self-pay | Admitting: Vascular Surgery

## 2020-07-28 ENCOUNTER — Other Ambulatory Visit: Payer: Self-pay

## 2020-07-28 ENCOUNTER — Ambulatory Visit: Payer: Medicare HMO | Admitting: Vascular Surgery

## 2020-07-28 VITALS — BP 111/62 | HR 70 | Temp 97.5°F | Resp 16 | Ht 75.0 in | Wt 196.6 lb

## 2020-07-28 DIAGNOSIS — I83811 Varicose veins of right lower extremities with pain: Secondary | ICD-10-CM

## 2020-07-28 HISTORY — PX: ENDOVENOUS ABLATION SAPHENOUS VEIN W/ LASER: SUR449

## 2020-07-28 NOTE — Progress Notes (Signed)
     Laser Ablation Procedure    Date: 07/28/2020   Wesley Black DOB:03-24-1951  Consent signed: Yes      Surgeon: Cari Caraway MD   Procedure: Laser Ablation: right Greater Saphenous Vein  BP 111/62 (BP Location: Left Arm, Patient Position: Sitting, Cuff Size: Normal)   Pulse 70   Temp (!) 97.5 F (36.4 C) (Temporal)   Resp 16   Ht 6\' 3"  (1.905 m)   Wt 196 lb 9.6 oz (89.2 kg)   SpO2 98%   BMI 24.57 kg/m   Tumescent Anesthesia: 450 cc 0.9% NaCl with 50 cc Lidocaine HCL 1%  and 15 cc 8.4% NaHCO3  Local Anesthesia: 6 cc Lidocaine HCL and NaHCO3 (ratio 2:1)  7 watts continuous mode     Total energy: 2031 Joules    Total time: 290 seconds Treatment Length 45 cm  Laser Fiber Ref. #  2032                             Lot #  [id number]   Stab Phlebectomy: <10 Sites: Calf  Patient tolerated procedure well  Notes: Patient wore face mask.  All staff members wore facial masks and facial shields/goggles.  [name] took Ativan 1 mg (1 tablet) at 7:35 AM on 07-28-2020.    Description of Procedure:  After marking the course of the secondary varicosities, the patient was placed on the operating table in the supine position, and the right leg was prepped and draped in sterile fashion.   Local anesthetic was administered and under ultrasound guidance the saphenous vein was accessed with a micro needle and guide wire; then the mirco puncture sheath was placed.  A guide wire was inserted saphenofemoral junction , followed by a 5 french sheath.  The position of the sheath and then the laser fiber below the junction was confirmed using the ultrasound.  Tumescent anesthesia was administered along the course of the saphenous vein using ultrasound guidance. The patient was placed in Trendelenburg position and protective laser glasses were placed on patient and staff, and the laser was fired at 7 watts continuous mode for a total of 2031 joules.   For stab phlebectomies, local anesthetic was  administered at the previously marked varicosities, and tumescent anesthesia was administered around the vessels.  Less than 10 stab wounds were made using the tip of an 11 blade. And using the vein hook, the phlebectomies were performed using a hemostat to avulse the varicosities.  Adequate hemostasis was achieved.     Steri strips were applied to the stab wounds and ABD pads and thigh high compression stockings were applied.  Ace wrap bandages were applied over the phlebectomy sites and at the top of the saphenofemoral junction. Blood loss was less than 15 cc.  Discharge instructions reviewed with patient and hardcopy of discharge instructions given to patient to take home. The patient ambulated out of the operating room having tolerated the procedure well.

## 2020-07-28 NOTE — Progress Notes (Signed)
Patient name: Wesley Black MRN: [id number] DOB: Mar 24, 1951 Sex: male  REASON FOR VISIT: For laser ablation of the right great saphenous vein with less than 10 stab phlebectomies  HPI: Wesley Black is a 70 y.o. male [place] for 63-month follow-up visit on 07/06/2020 with painful varicose veins of the right lower extremity.  He had been seen in December by the physicians assistant.  He was encouraged to elevate his legs, wear thigh-high compression stockings with a gradient of 20 to 30 mmHg, and exercise.  He was having persistent heaviness in the right leg and had evidence of venous hypertension.  He had a previous DVT in the right leg in 2001.  He has had no previous venous procedures.  On my exam he had palpable pedal pulses.  He had some right lower extremity swelling.  Some dilated varicose veins in the medial right calf and some reticular and spider veins.  His previous venous duplex scan showed superficial venous reflux involving the right great saphenous vein from the saphenofemoral junction of the proximal calf.  The diameters of the vein ranged from 0.59-0.77 cm.  He had CEAP C3 venous disease.    Current Outpatient Medications  Medication Sig Dispense Refill  . b complex vitamins tablet Take 1 tablet by mouth daily.    Marland Kitchen LORazepam (ATIVAN) 1 MG tablet Take 1 tablet 30 minutes prior to leaving house on day of office surgery.  Bring second tablet with you to the office on day off office surgery. 2 tablet 0  . methimazole (TAPAZOLE) 5 MG tablet Take 1 tablet (5 mg total) by mouth daily. 90 tablet 3   No current facility-administered medications for this visit.    PHYSICAL EXAM: Vitals:   07/28/20 0837  BP: 111/62  Pulse: 70  Resp: 16  Temp: (!) 97.5 F (36.4 C)  TempSrc: Temporal  SpO2: 98%  Weight: 196 lb 9.6 oz (89.2 kg)  Height: 6\' 3"  (1.905 m)    PROCEDURE: Endovenous laser ablation right great saphenous vein with less than 10 stabs  TECHNIQUE: The patient was taken to the  exam room and the dilated veins were marked with the patient standing.  The patient was then placed supine.  I looked at the right great saphenous vein myself with the SonoSite and I felt that we could cannulate this in the proximal calf.  The right leg was prepped and draped in usual sterile fashion.  Under ultrasound guidance, after the skin was anesthetized, I cannulated the left great saphenous vein in the proximal calf.  A micropuncture sheath was introduced over the micropuncture wire.  I then advanced the J-wire to just below the saphenofemoral junction.  I then advanced a 65 cm sheath to just below the saphenofemoral junction and the wire and dilator were removed.  I then positioned the laser fiber at the end of the sheath and the sheath was retracted.  The laser fiber was positioned approximately 2.5 cm distal to the saphenofemoral junction.  Tumescent anesthesia was then administered circumferentially around the vein.  The patient was placed in Trendelenburg.  Position of the laser fiber was again confirmed.  Laser ablation was performed of the left great saphenous vein from 2.5 cm distal to the saphenofemoral junction to the proximal calf.  50 J/cm was used at 7 W.   Attention was turned to stab phlebectomies.  The areas that were marked were anesthetized with tumescent anesthesia.  6-8 small stab incisions were made and the veins were  hooked brought above the skin and then gently excised using blunt dissection with a hemostat.  A pressure dressing was applied.  Patient tolerated the procedure well.  He will return next week for follow-up duplex.  [name] [facility] [place] [phone]

## 2020-08-04 ENCOUNTER — Ambulatory Visit: Payer: Medicare HMO | Admitting: Vascular Surgery

## 2020-08-04 ENCOUNTER — Ambulatory Visit (HOSPITAL_COMMUNITY)
Admission: RE | Admit: 2020-08-04 | Discharge: 2020-08-04 | Disposition: A | Discharge status: Home / Self Care | Payer: Medicare HMO | Source: Ambulatory Visit | Attending: Vascular Surgery | Admitting: Vascular Surgery

## 2020-08-04 ENCOUNTER — Other Ambulatory Visit: Payer: Self-pay

## 2020-08-04 ENCOUNTER — Encounter: Payer: Self-pay | Admitting: Vascular Surgery

## 2020-08-04 VITALS — BP 120/70 | HR 58 | Temp 97.5°F | Resp 18

## 2020-08-04 DIAGNOSIS — I872 Venous insufficiency (chronic) (peripheral): Secondary | ICD-10-CM | POA: Diagnosis not present

## 2020-08-04 DIAGNOSIS — I83811 Varicose veins of right lower extremities with pain: Secondary | ICD-10-CM | POA: Diagnosis not present

## 2020-08-04 NOTE — Progress Notes (Signed)
   Patient name: Wesley Black MRN: [id number] DOB: [birth year] Sex: male  REASON FOR VISIT: Follow-up after endovenous laser ablation of the right great saphenous vein with less than 10 stabs  HPI: Wesley Black is a 70 y.o. male who had presented with chronic venous insufficiency with both superficial and deep venous reflux bilaterally.  He had CEAP C3 venous disease.  He failed conservative treatment and presented for laser ablation of the right great saphenous vein with less than 10 stabs.  On 07/28/2020 he underwent laser ablation of the right great saphenous vein from 2.5 cm distal to the saphenofemoral junction to the proximal calf.  He had less than 10 stabs.  He comes in for a 1 week follow-up visit.  Overall he states that his right leg feels much better.  He had no significant bruising.  He denies chest pain or shortness of breath.  Current Outpatient Medications  Medication Sig Dispense Refill  . b complex vitamins tablet Take 1 tablet by mouth daily.    . methimazole (TAPAZOLE) 5 MG tablet Take 1 tablet (5 mg total) by mouth daily. 90 tablet 3  . LORazepam (ATIVAN) 1 MG tablet Take 1 tablet 30 minutes prior to leaving house on day of office surgery.  Bring second tablet with you to the office on day off office surgery. 2 tablet 0   No current facility-administered medications for this visit.   REVIEW OF SYSTEMS: Arly.Keller ] denotes positive finding; [  ] denotes negative finding  CARDIOVASCULAR:  [ ]  chest pain   [ ]  dyspnea on exertion  [ ]  leg swelling  CONSTITUTIONAL:  [ ]  fever   [ ]  chills  PHYSICAL EXAM: Vitals:   08/04/20 1050  BP: 120/70  Pulse: (!) 58  Resp: 18  Temp: (!) 97.5 F (36.4 C)  TempSrc: Temporal  SpO2: 98%   GENERAL: The patient is a well-nourished male, in no acute distress. The vital signs are documented above. CARDIOVASCULAR: There is a regular rate and rhythm. PULMONARY: There is good air exchange bilaterally without wheezing or rales. VASCULAR: He has  no significant bruising in the right leg.  He has no significant leg swelling.  DATA:  VENOUS DUPLEX: I have independently interpreted his venous duplex scan today.  He had successful closure of the right great saphenous vein starting 0.9 cm distal to the saphenofemoral junction.  There is no evidence of DVT.  MEDICAL ISSUES:  STATUS POST LASER ABLATION RIGHT GREAT SAPHENOUS VEIN: Patient has done well status post laser ablation of the right great saphenous vein.  He has no evidence of DVT.  His right leg feels better.  He will gradually resume his training but I encouraged him to go slowly.  No heavy lifting for 2 months.  We have no plans for any procedures on the left side.  I will see him back as needed.  [facility] [place] [phone]

## 2020-08-08 ENCOUNTER — Other Ambulatory Visit: Payer: Self-pay

## 2020-08-08 ENCOUNTER — Ambulatory Visit: Payer: Medicare HMO | Admitting: Endocrinology

## 2020-08-08 VITALS — BP 122/74 | HR 60 | Ht 75.0 in | Wt 208.6 lb

## 2020-08-08 DIAGNOSIS — E0590 Thyrotoxicosis, unspecified without thyrotoxic crisis or storm: Secondary | ICD-10-CM

## 2020-08-08 LAB — TSH: TSH: 4.66 u[IU]/mL — ABNORMAL HIGH (ref 0.35–4.50)

## 2020-08-08 LAB — T4, FREE: Free T4: 0.97 ng/dL (ref 0.60–1.60)

## 2020-08-08 MED ORDER — METHIMAZOLE 5 MG PO TABS: 2.5000 mg | ORAL_TABLET | Freq: Every day | ORAL | 3 refills | Status: DC

## 2020-08-08 NOTE — Progress Notes (Signed)
Subjective:    Patient ID: Wesley Black, male    DOB: [birth year], 70 y.o.   MRN: [id number]  HPI Pt returns for f/u of hyperthyroidism (dx'ed 2021; he chose tapazole rx; he has never had thyroid imaging; tapazole was stopped 1/22, due to hypothyroidism, and resumed soon at a lower dosage soon thereafter).  pt states he feels well in general, except for weight gain.   Past Medical History:  Diagnosis Date  . DVT (deep venous thrombosis) (HCC)   . Fracture    Multiple    Past Surgical History:  Procedure Laterality Date  . COLONOSCOPY    . ENDOVENOUS ABLATION SAPHENOUS VEIN W/ LASER Right 07/28/2020   endovenous laser ablation right greater saphenous vein and stab phlebectomy < 10 incisions right leg by Cari Caraway MD   . KNEE ARTHROPLASTY Right 2004  . LASIK Bilateral 2001  . SHOULDER SURGERY Left 1984    Social History   Socioeconomic History  . Marital status: Single    Spouse name: Not on file  . Number of children: 0  . Years of education: Master's  . Highest education level: Not on file  Occupational History  . Occupation: Retired  Tobacco Use  . Smoking status: Never Smoker  . Smokeless tobacco: Never Used  Vaping Use  . Vaping Use: Never used  Substance and Sexual Activity  . Alcohol use: Yes    Comment: 2 glasses of wine or beer per month  . Drug use: No  . Sexual activity: Not on file    Comment: Married  Other Topics Concern  . Not on file  Social History Narrative   Lives at home, separated   Left-handed   Caffeine: 1 glass of tea per day, occasional 7 oz Pepsi (every 2 wks)      Social Determinants of Health   Financial Resource Strain: Low Risk   . Difficulty of Paying Living Expenses: Not hard at all  Food Insecurity: No Food Insecurity  . Worried About Programme researcher, broadcasting/film/video in the Last Year: Never true  . Ran Out of Food in the Last Year: Never true  Transportation Needs: No Transportation Needs  . Lack of Transportation (Medical): No  .  Lack of Transportation (Non-Medical): No  Physical Activity: Sufficiently Active  . Days of Exercise per Week: 3 days  . Minutes of Exercise per Session: 120 min  Stress: No Stress Concern Present  . Feeling of Stress : Not at all  Social Connections: Moderately Isolated  . Frequency of Communication with Friends and Family: Once a week  . Frequency of Social Gatherings with Friends and Family: Once a week  . Attends Religious Services: 1 to 4 times per year  . Active Member of Clubs or Organizations: Yes  . Attends Banker Meetings: 1 to 4 times per year  . Marital Status: Never married  Intimate Partner Violence: Not At Risk  . Fear of Current or Ex-Partner: No  . Emotionally Abused: No  . Physically Abused: No  . Sexually Abused: No    Current Outpatient Medications on File Prior to Visit  Medication Sig Dispense Refill  . b complex vitamins tablet Take 1 tablet by mouth daily.     No current facility-administered medications on file prior to visit.    No Known Allergies  Family History  Problem Relation Age of Onset  . Hypertension Mother   . Arthritis Mother   . Hyperlipidemia Mother   . Prostate cancer  Father   . Cancer Father   . Hyperlipidemia Father   . Hypertension Father   . Prostate cancer Brother   . Obesity Brother   . Hyperthyroidism Brother   . Asthma Maternal Grandmother   . Hyperlipidemia Maternal Grandfather   . Hypertension Maternal Grandfather   . Stroke Maternal Grandfather   . COPD Paternal Grandfather   . Colon cancer Neg Hx   . Esophageal cancer Neg Hx   . Rectal cancer Neg Hx   . Stomach cancer Neg Hx     BP 122/74 (BP Location: Right Arm, Patient Position: Sitting, Cuff Size: Normal)   Pulse 60   Ht 6\' 3"  (1.905 m)   Wt 208 lb 9.6 oz (94.6 kg)   SpO2 98%   BMI 26.07 kg/m    Review of Systems Denies fever.      Objective:   Physical Exam VITAL SIGNS:  See vs page GENERAL: no distress NECK: There is no palpable  thyroid enlargement.  No thyroid nodule is palpable.  No palpable lymphadenopathy at the anterior neck.   Lab Results  Component Value Date   TSH 4.66 (H) 08/08/2020       Assessment & Plan:  Hypothyroidism, due to tapazole.  Reduce to 2.5 mg qd.

## 2020-08-08 NOTE — Patient Instructions (Addendum)
Blood tests are requested for you today.  We'll let you know about the results.  If ever you have fever while taking methimazole, stop it and call us, even if the reason is obvious, because of the risk of a rare side-effect. It is best to never miss the medication.  However, if you do miss it, next best is to double up the next time.   Please come back for a follow-up appointment in 2 months.  

## 2020-08-09 ENCOUNTER — Telehealth: Payer: Self-pay | Admitting: *Deleted

## 2020-08-09 NOTE — Telephone Encounter (Signed)
Returning call from Mr. Tandon regarding post op questions.  Mr. Scorsone is s/p endovenous laser ablation right greater saphenous vein and stab phlebectomy <10 incisions right leg by Dr. Edilia Bo on 07-28-2020.  Mr. Aikman reports that since Saturday, April 23 when he gets up from lying down position he experiences sharp pain in his right shin for 1-2 minutes after his gets up and starts moving.  He reports after 1-2 minutes of moving and walking this pain subsides.  Mr. Kidney is elevating his legs and wearing his compression hose consistently.  Will discuss with Dr. Edilia Bo tomorrow when he is in office and will call Mr. Cato with recommendations.  Answered Mr. Maultsby' post op questions regarding Ibuprofen and swelling.  Mr. Bunte verbalized understanding.

## 2020-08-10 ENCOUNTER — Telehealth: Payer: Self-pay | Admitting: *Deleted

## 2020-08-10 NOTE — Telephone Encounter (Signed)
Discussed Mr. Wesley Black' pain in right shin upon bearing weight for 1-2 minutes (after lying down and moving to standing position) with Dr. Edilia Black.  He recommended Mr. Wesley Black continue to wear compression hose and elevate his leg when sitting. Dr. Edilia Black advised Mr. Wesley Black to continue monitoring for 3-4 weeks and call [facility] if symptoms worsened.  Mr. Wesley Black verbalized understanding of instructions.

## 2020-08-12 ENCOUNTER — Encounter: Payer: Self-pay | Admitting: Endocrinology

## 2020-08-12 MED ORDER — METHIMAZOLE 5 MG PO TABS: 2.5000 mg | ORAL_TABLET | Freq: Every day | ORAL | 3 refills | Status: DC

## 2020-09-16 ENCOUNTER — Ambulatory Visit: Payer: Medicare HMO | Admitting: Endocrinology

## 2020-09-19 ENCOUNTER — Telehealth: Payer: Self-pay

## 2020-09-19 NOTE — Telephone Encounter (Signed)
Pt called with questions regarding feeling fatigue after he cycles near 30 miles. He felt he gradually worked back up to this level of exercise and did not used to feel such fatigue prior to his laser ablation. I have encouraged him to scale back his mileage and see how he feels and to more gradually and slowly work back up. I will have vein nurse call him this week as well.

## 2020-09-29 ENCOUNTER — Encounter: Payer: Self-pay | Admitting: Family

## 2020-09-29 ENCOUNTER — Ambulatory Visit (INDEPENDENT_AMBULATORY_CARE_PROVIDER_SITE_OTHER): Payer: Medicare HMO | Admitting: Family

## 2020-09-29 ENCOUNTER — Other Ambulatory Visit: Payer: Self-pay

## 2020-09-29 VITALS — BP 117/71 | HR 61 | Temp 98.2°F | Ht 75.0 in | Wt 199.8 lb

## 2020-09-29 DIAGNOSIS — E0590 Thyrotoxicosis, unspecified without thyrotoxic crisis or storm: Secondary | ICD-10-CM

## 2020-09-29 DIAGNOSIS — R5383 Other fatigue: Secondary | ICD-10-CM

## 2020-09-29 NOTE — Patient Instructions (Signed)

## 2020-09-30 LAB — CBC WITH DIFFERENTIAL/PLATELET
Basophils Absolute: 0 10*3/uL (ref 0.0–0.1)
Basophils Relative: 0.6 % (ref 0.0–3.0)
Eosinophils Absolute: 0.2 10*3/uL (ref 0.0–0.7)
Eosinophils Relative: 3.1 % (ref 0.0–5.0)
HCT: 42.7 % (ref 39.0–52.0)
Hemoglobin: 15 g/dL (ref 13.0–17.0)
Lymphocytes Relative: 31.3 % (ref 12.0–46.0)
Lymphs Abs: 1.9 10*3/uL (ref 0.7–4.0)
MCHC: 35 g/dL (ref 30.0–36.0)
MCV: 89.8 fl (ref 78.0–100.0)
Monocytes Absolute: 0.5 10*3/uL (ref 0.1–1.0)
Monocytes Relative: 7.4 % (ref 3.0–12.0)
Neutro Abs: 3.6 10*3/uL (ref 1.4–7.7)
Neutrophils Relative %: 57.6 % (ref 43.0–77.0)
Platelets: 163 10*3/uL (ref 150.0–400.0)
RBC: 4.76 Mil/uL (ref 4.22–5.81)
RDW: 13.5 % (ref 11.5–15.5)
WBC: 6.2 10*3/uL (ref 4.0–10.5)

## 2020-09-30 LAB — COMPREHENSIVE METABOLIC PANEL WITH GFR
ALT: 10 U/L (ref 0–53)
AST: 21 U/L (ref 0–37)
Albumin: 4.4 g/dL (ref 3.5–5.2)
Alkaline Phosphatase: 67 U/L (ref 39–117)
BUN: 24 mg/dL — ABNORMAL HIGH (ref 6–23)
CO2: 29 meq/L (ref 19–32)
Calcium: 9.7 mg/dL (ref 8.4–10.5)
Chloride: 102 meq/L (ref 96–112)
Creatinine, Ser: 1.28 mg/dL (ref 0.40–1.50)
GFR: 57.79 mL/min — ABNORMAL LOW
Glucose, Bld: 81 mg/dL (ref 70–99)
Potassium: 3.9 meq/L (ref 3.5–5.1)
Sodium: 141 meq/L (ref 135–145)
Total Bilirubin: 0.6 mg/dL (ref 0.2–1.2)
Total Protein: 7.1 g/dL (ref 6.0–8.3)

## 2020-09-30 LAB — TESTOSTERONE: Testosterone: 380.39 ng/dL (ref 300.00–890.00)

## 2020-09-30 LAB — THYROID PANEL WITH TSH
Free Thyroxine Index: 2.5 (ref 1.4–3.8)
T3 Uptake: 33 % (ref 22–35)
T4, Total: 7.7 ug/dL (ref 4.9–10.5)
TSH: 2.09 mIU/L (ref 0.40–4.50)

## 2020-09-30 NOTE — Progress Notes (Signed)
Acute Office Visit  Subjective:    Patient ID: Wesley Black, male    DOB: January 08, 1951, 70 y.o.   MRN: [id number]  Chief Complaint  Patient presents with  . Fatigue    On going since November, has not been able to get any good sleep either    HPI Patient is in today with complaints of chronic fatigue for about 7 months.  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  However, over the last several months he has been more exhausted and not well rested at night.  He has no difficulty falling asleep.  Reports a history of depression that typically occurs in the winter months.  However, he does not feel like this is the same type of dread that he normally has.  He has an appointment to see his endocrinologist in 10 days.  Has a history of hyperthyroidism.  He has had some issues with erectile dysfunction in the past.  Is unsure if he has had any issues with low testosterone levels in the past.  Reports having a colonoscopy less than 2 years ago that was completely normal.  He denies any chest pain or shortness of breath.  Past Medical History:  Diagnosis Date  . DVT (deep venous thrombosis) ([place])   . Fracture    Multiple    Past Surgical History:  Procedure Laterality Date  . COLONOSCOPY    . ENDOVENOUS ABLATION SAPHENOUS VEIN W/ LASER Right 07/28/2020   endovenous laser ablation right greater saphenous vein and stab phlebectomy < 10 incisions right leg by Gae Gallop MD   . KNEE ARTHROPLASTY Right 2004  . LASIK Bilateral 2001  . SHOULDER SURGERY Left 1984    Family History  Problem Relation Age of Onset  . Hypertension Mother   . Arthritis Mother   . Hyperlipidemia Mother   . Prostate cancer Father   . Cancer Father   . Hyperlipidemia Father   . Hypertension Father   . Prostate cancer Brother   . Obesity Brother   . Hyperthyroidism Brother   . Asthma Maternal Grandmother   . Hyperlipidemia Maternal Grandfather   . Hypertension Maternal Grandfather   .  Stroke Maternal Grandfather   . COPD Paternal Grandfather   . Colon cancer Neg Hx   . Esophageal cancer Neg Hx   . Rectal cancer Neg Hx   . Stomach cancer Neg Hx     Social History   Socioeconomic History  . Marital status: Single    Spouse name: Not on file  . Number of children: 0  . Years of education: Master's  . Highest education level: Not on file  Occupational History  . Occupation: Retired  Tobacco Use  . Smoking status: Never  . Smokeless tobacco: Never  Vaping Use  . Vaping Use: Never used  Substance and Sexual Activity  . Alcohol use: Yes    Comment: 2 glasses of wine or beer per month  . Drug use: No  . Sexual activity: Not on file    Comment: Married  Other Topics Concern  . Not on file  Social History Narrative   Lives at home, separated   Left-handed   Caffeine: 1 glass of tea per day, occasional 7 oz Pepsi (every 2 wks)      Social Determinants of Health   Financial Resource Strain: Low Risk   . Difficulty of Paying Living Expenses: Not hard at all  Food Insecurity: No Food Insecurity  .  Worried About Charity fundraiser in the Last Year: Never true  . Ran Out of Food in the Last Year: Never true  Transportation Needs: No Transportation Needs  . Lack of Transportation (Medical): No  . Lack of Transportation (Non-Medical): No  Physical Activity: Sufficiently Active  . Days of Exercise per Week: 3 days  . Minutes of Exercise per Session: 120 min  Stress: No Stress Concern Present  . Feeling of Stress : Not at all  Social Connections: Moderately Isolated  . Frequency of Communication with Friends and Family: Once a week  . Frequency of Social Gatherings with Friends and Family: Once a week  . Attends Religious Services: 1 to 4 times per year  . Active Member of Clubs or Organizations: Yes  . Attends Archivist Meetings: 1 to 4 times per year  . Marital Status: Never married  Intimate Partner Violence: Not At Risk  . Fear of Current or  Ex-Partner: No  . Emotionally Abused: No  . Physically Abused: No  . Sexually Abused: No    Outpatient Medications Prior to Visit  Medication Sig Dispense Refill  . b complex vitamins tablet Take 1 tablet by mouth daily.    . methimazole (TAPAZOLE) 5 MG tablet Take 0.5 tablets (2.5 mg total) by mouth daily. 45 tablet 3   No facility-administered medications prior to visit.    No Known Allergies  Review of Systems  Constitutional:  Positive for fatigue.  Respiratory: Negative.    Cardiovascular: Negative.   Psychiatric/Behavioral:  Positive for sleep disturbance. Negative for behavioral problems. The patient is not nervous/anxious.   All other systems reviewed and are negative.     Objective:    Physical Exam Vitals reviewed.  Constitutional:      Appearance: Normal appearance.  Cardiovascular:     Rate and Rhythm: Normal rate and regular rhythm.  Pulmonary:     Effort: Pulmonary effort is normal.     Breath sounds: Normal breath sounds.  Abdominal:     General: Abdomen is flat.     Palpations: Abdomen is soft.  Musculoskeletal:        General: Normal range of motion.     Cervical back: Normal range of motion and neck supple.  Skin:    General: Skin is warm and dry.  Neurological:     General: No focal deficit present.     Mental Status: He is alert and oriented to person, place, and time.  Psychiatric:        Mood and Affect: Mood normal.        Behavior: Behavior normal.   BP 117/71   Pulse 61   Temp 98.2 F (36.8 C) (Temporal)   Ht '6\' 3"'  (1.905 m)   Wt 199 lb 12.8 oz (90.6 kg)   SpO2 97%   BMI 24.97 kg/m  Wt Readings from Last 3 Encounters:  09/29/20 199 lb 12.8 oz (90.6 kg)  08/08/20 208 lb 9.6 oz (94.6 kg)  07/28/20 196 lb 9.6 oz (89.2 kg)    Health Maintenance Due  Topic Date Due  . Zoster Vaccines- Shingrix (1 of 2) Never done  . COVID-19 Vaccine (4 - Booster for Moderna series) 07/02/2020    There are no preventive care reminders to  display for this patient.   Lab Results  Component Value Date   TSH 2.09 09/29/2020   Lab Results  Component Value Date   WBC 4.4 09/30/2019   HGB 14.0 09/30/2019  HCT 40.1 09/30/2019   MCV 91.1 09/30/2019   PLT 129.0 (L) 09/30/2019   Lab Results  Component Value Date   NA 140 09/30/2019   K 4.1 09/30/2019   CO2 31 09/30/2019   GLUCOSE 85 09/30/2019   BUN 17 09/30/2019   CREATININE 1.06 09/30/2019   BILITOT 0.8 09/30/2019   ALKPHOS 59 09/30/2019   AST 18 09/30/2019   ALT 10 09/30/2019   PROT 6.5 09/30/2019   ALBUMIN 4.1 09/30/2019   CALCIUM 8.8 09/30/2019   GFR 69.72 09/30/2019   Lab Results  Component Value Date   CHOL 136 11/26/2019   Lab Results  Component Value Date   HDL 60 11/26/2019   Lab Results  Component Value Date   LDLCALC 57 11/26/2019   Lab Results  Component Value Date   TRIG 107 11/26/2019   Lab Results  Component Value Date   CHOLHDL 2.3 11/26/2019   No results found for: HGBA1C     Assessment & Plan:   Problem List Items Addressed This Visit     Hyperthyroidism   Other Visit Diagnoses     Fatigue, unspecified type    -  Primary   Relevant Orders   Thyroid Panel With TSH (Completed)   CBC with Differential/Platelet   Comp Met (CMET)   Testosterone       Plan: Etiology of fatigue is unknown at present.  However, will obtain labs today to include a thyroid panel, CBC, CMP, testosterone level.  Last PSA in February was normal.  We will follow-up pending the results and discuss treatment plan thereafter.  No suspicion for any cardiac etiology.   Kennyth Arnold, FNP

## 2020-10-04 ENCOUNTER — Other Ambulatory Visit: Payer: Self-pay | Admitting: *Deleted

## 2020-10-04 ENCOUNTER — Telehealth: Payer: Self-pay

## 2020-10-04 DIAGNOSIS — R7989 Other specified abnormal findings of blood chemistry: Secondary | ICD-10-CM

## 2020-10-04 NOTE — Telephone Encounter (Signed)
Patient called in stating that he was advised to follow up on labs, but unclear if it needs to be another lab appointment or if he needs to see Hyman Hopes or Dr.Parker. No lab orders placed.

## 2020-10-04 NOTE — Telephone Encounter (Signed)
Left voice message, need lab appointment

## 2020-10-07 ENCOUNTER — Other Ambulatory Visit: Payer: Self-pay

## 2020-10-07 ENCOUNTER — Other Ambulatory Visit (INDEPENDENT_AMBULATORY_CARE_PROVIDER_SITE_OTHER): Payer: Medicare HMO

## 2020-10-07 DIAGNOSIS — R7989 Other specified abnormal findings of blood chemistry: Secondary | ICD-10-CM | POA: Diagnosis not present

## 2020-10-07 LAB — TESTOSTERONE: Testosterone: 376.77 ng/dL (ref 300.00–890.00)

## 2020-10-10 ENCOUNTER — Other Ambulatory Visit: Payer: Self-pay | Admitting: Family

## 2020-10-10 DIAGNOSIS — R5383 Other fatigue: Secondary | ICD-10-CM

## 2020-10-11 ENCOUNTER — Ambulatory Visit (INDEPENDENT_AMBULATORY_CARE_PROVIDER_SITE_OTHER): Payer: Medicare HMO | Admitting: Endocrinology

## 2020-10-11 ENCOUNTER — Encounter: Payer: Self-pay | Admitting: Endocrinology

## 2020-10-11 ENCOUNTER — Other Ambulatory Visit: Payer: Self-pay

## 2020-10-11 VITALS — HR 53 | Ht 75.0 in | Wt 201.4 lb

## 2020-10-11 DIAGNOSIS — E0590 Thyrotoxicosis, unspecified without thyrotoxic crisis or storm: Secondary | ICD-10-CM | POA: Diagnosis not present

## 2020-10-11 DIAGNOSIS — R5383 Other fatigue: Secondary | ICD-10-CM | POA: Insufficient documentation

## 2020-10-11 LAB — T4, FREE: Free T4: 1 ng/dL (ref 0.60–1.60)

## 2020-10-11 LAB — CORTISOL: Cortisol, Plasma: 8.6 ug/dL

## 2020-10-11 LAB — TSH: TSH: 1.28 u[IU]/mL (ref 0.35–4.50)

## 2020-10-11 NOTE — Patient Instructions (Addendum)
Blood tests are requested for you today.  We'll let you know about the results.   If ever you have fever while taking methimazole, stop it and call us, even if the reason is obvious, because of the risk of a rare side-effect.  It is best to never miss the medication.  However, if you do miss it, next best is to double up the next time.   Please come back for a follow-up appointment in 4 months.   

## 2020-10-11 NOTE — Progress Notes (Signed)
Subjective:    Patient ID: Wesley Black, male    DOB: 1950-10-02, 70 y.o.   MRN: [id number]  HPI Pt returns for f/u of hyperthyroidism (dx'ed 2021; he chose tapazole rx; he has never had thyroid imaging; tapazole was stopped 1/22, due to hypothyroidism, and resumed at a lower dosage soon thereafter).  pt states he feels well in general, except for fatigue.  He requests to recheck TFT today.   Past Medical History:  Diagnosis Date   DVT (deep venous thrombosis) (HCC)    Fracture    Multiple    Past Surgical History:  Procedure Laterality Date   COLONOSCOPY     ENDOVENOUS ABLATION SAPHENOUS VEIN W/ LASER Right 07/28/2020   endovenous laser ablation right greater saphenous vein and stab phlebectomy < 10 incisions right leg by Cari Caraway MD    KNEE ARTHROPLASTY Right 2004   LASIK Bilateral 2001   SHOULDER SURGERY Left 1984    Social History   Socioeconomic History   Marital status: Single    Spouse name: Not on file   Number of children: 0   Years of education: Master's   Highest education level: Not on file  Occupational History   Occupation: Retired  Tobacco Use   Smoking status: Never   Smokeless tobacco: Never  Vaping Use   Vaping Use: Never used  Substance and Sexual Activity   Alcohol use: Yes    Comment: 2 glasses of wine or beer per month   Drug use: No   Sexual activity: Not on file    Comment: Married  Other Topics Concern   Not on file  Social History Narrative   Lives at home, separated   Left-handed   Caffeine: 1 glass of tea per day, occasional 7 oz Pepsi (every 2 wks)      Social Determinants of Health   Financial Resource Strain: Low Risk    Difficulty of Paying Living Expenses: Not hard at all  Food Insecurity: No Food Insecurity   Worried About Programme researcher, broadcasting/film/video in the Last Year: Never true   Ran Out of Food in the Last Year: Never true  Transportation Needs: No Transportation Needs   Lack of Transportation (Medical): No   Lack of  Transportation (Non-Medical): No  Physical Activity: Sufficiently Active   Days of Exercise per Week: 3 days   Minutes of Exercise per Session: 120 min  Stress: No Stress Concern Present   Feeling of Stress : Not at all  Social Connections: Moderately Isolated   Frequency of Communication with Friends and Family: Once a week   Frequency of Social Gatherings with Friends and Family: Once a week   Attends Religious Services: 1 to 4 times per year   Active Member of [employer] or Organizations: Yes   Attends Banker Meetings: 1 to 4 times per year   Marital Status: Never married  Catering manager Violence: Not At Risk   Fear of Current or Ex-Partner: No   Emotionally Abused: No   Physically Abused: No   Sexually Abused: No    Current Outpatient Medications on File Prior to Visit  Medication Sig Dispense Refill   b complex vitamins tablet Take 1 tablet by mouth daily.     methimazole (TAPAZOLE) 5 MG tablet Take 0.5 tablets (2.5 mg total) by mouth daily. 45 tablet 3   No current facility-administered medications on file prior to visit.    No Known Allergies  Family History  Problem Relation  Age of Onset   Hypertension Mother    Arthritis Mother    Hyperlipidemia Mother    Prostate cancer Father    Cancer Father    Hyperlipidemia Father    Hypertension Father    Prostate cancer Brother    Obesity Brother    Hyperthyroidism Brother    Asthma Maternal Grandmother    Hyperlipidemia Maternal Grandfather    Hypertension Maternal Grandfather    Stroke Maternal Grandfather    COPD Paternal Grandfather    Colon cancer Neg Hx    Esophageal cancer Neg Hx    Rectal cancer Neg Hx    Stomach cancer Neg Hx     Pulse (!) 53   Ht 6\' 3"  (1.905 m)   Wt 201 lb 6.4 oz (91.4 kg)   SpO2 98%   BMI 25.17 kg/m    Review of Systems Denies fever.      Objective:   Physical Exam NECK: There is no palpable thyroid enlargement.  No thyroid nodule is palpable.  No palpable  lymphadenopathy at the anterior neck.    Lab Results  Component Value Date   TSH 1.28 10/11/2020   T4TOTAL 7.7 09/29/2020      Assessment & Plan:  Hyperthyroidism: well-controlled.  Please continue the same methimazole.

## 2020-11-28 ENCOUNTER — Encounter: Payer: Self-pay | Admitting: Family Medicine

## 2020-11-28 ENCOUNTER — Other Ambulatory Visit: Payer: Self-pay

## 2020-11-28 ENCOUNTER — Ambulatory Visit (INDEPENDENT_AMBULATORY_CARE_PROVIDER_SITE_OTHER): Payer: Medicare HMO | Admitting: Family Medicine

## 2020-11-28 VITALS — BP 112/68 | HR 57 | Temp 97.0°F | Ht 75.0 in | Wt 196.8 lb

## 2020-11-28 DIAGNOSIS — E0590 Thyrotoxicosis, unspecified without thyrotoxic crisis or storm: Secondary | ICD-10-CM | POA: Diagnosis not present

## 2020-11-28 DIAGNOSIS — Z13220 Encounter for screening for lipoid disorders: Secondary | ICD-10-CM

## 2020-11-28 DIAGNOSIS — R69 Illness, unspecified: Secondary | ICD-10-CM | POA: Diagnosis not present

## 2020-11-28 DIAGNOSIS — Z0001 Encounter for general adult medical examination with abnormal findings: Secondary | ICD-10-CM | POA: Diagnosis not present

## 2020-11-28 DIAGNOSIS — F320 Major depressive disorder, single episode, mild: Secondary | ICD-10-CM

## 2020-11-28 LAB — COMPREHENSIVE METABOLIC PANEL
ALT: 10 U/L (ref 0–53)
AST: 17 U/L (ref 0–37)
Albumin: 4.2 g/dL (ref 3.5–5.2)
Alkaline Phosphatase: 60 U/L (ref 39–117)
BUN: 21 mg/dL (ref 6–23)
CO2: 31 mEq/L (ref 19–32)
Calcium: 9.4 mg/dL (ref 8.4–10.5)
Chloride: 104 mEq/L (ref 96–112)
Creatinine, Ser: 1.25 mg/dL (ref 0.40–1.50)
GFR: 59.39 mL/min — ABNORMAL LOW (ref >60.00)
Glucose, Bld: 75 mg/dL (ref 70–99)
Potassium: 5 mEq/L (ref 3.5–5.1)
Sodium: 140 mEq/L (ref 135–145)
Total Bilirubin: 0.8 mg/dL (ref 0.2–1.2)
Total Protein: 6.5 g/dL (ref 6.0–8.3)

## 2020-11-28 LAB — T4, FREE: Free T4: 0.96 ng/dL (ref 0.60–1.60)

## 2020-11-28 LAB — CBC
HCT: 43.6 % (ref 39.0–52.0)
Hemoglobin: 14.7 g/dL (ref 13.0–17.0)
MCHC: 33.7 g/dL (ref 30.0–36.0)
MCV: 91.7 fl (ref 78.0–100.0)
Platelets: 128 10*3/uL — ABNORMAL LOW (ref 150.0–400.0)
RBC: 4.75 Mil/uL (ref 4.22–5.81)
RDW: 14.3 % (ref 11.5–15.5)
WBC: 4 10*3/uL (ref 4.0–10.5)

## 2020-11-28 LAB — LIPID PANEL
Cholesterol: 183 mg/dL (ref 0–200)
HDL: 71.8 mg/dL (ref >39.00)
LDL Cholesterol: 96 mg/dL (ref 0–99)
NonHDL: 111.11
Total CHOL/HDL Ratio: 3
Triglycerides: 76 mg/dL (ref 0.0–149.0)
VLDL: 15.2 mg/dL (ref 0.0–40.0)

## 2020-11-28 LAB — TSH: TSH: 2.04 u[IU]/mL (ref 0.35–5.50)

## 2020-11-28 NOTE — Patient Instructions (Signed)
It was very nice to see you today!  We will check blood work today.  I will get you into see a therapist.   I will see you back in 1 year for your next physical. Come back to see me sooner if needed.   Take care, Dr [name]  PLEASE NOTE:  If you had any lab tests please let us know if you have not heard back within a few days. You may see your results on mychart before we have a chance to review them but we will give you a call once they are reviewed by [place]. If we ordered any referrals today, please let us know if you have not heard from their office within the next week.   Please try these tips to maintain a healthy lifestyle:  Eat at least 3 REAL meals and 1-2 snacks per day.  Aim for no more than 5 hours between eating.  If you eat breakfast, please do so within one hour of getting up.   Each meal should contain half fruits/vegetables, one quarter protein, and one quarter carbs (no bigger than a computer mouse)  Cut down on sweet beverages. This includes juice, soda, and sweet tea.   Drink at least 1 glass of water with each meal and aim for at least 8 glasses per day  Exercise at least 150 minutes every week.    Preventive Care 14 Years and Older, Male Preventive care refers to lifestyle choices and visits with your health care provider that can promote health and wellness. This includes: A yearly physical exam. This is also called an annual wellness visit. Regular dental and eye exams. Immunizations. Screening for certain conditions. Healthy lifestyle choices, such as: Eating a healthy diet. Getting regular exercise. Not using drugs or products that contain nicotine and tobacco. Limiting alcohol use. What can I expect for my preventive care visit? Physical exam Your health care provider will check your: Height and weight. These may be used to calculate your BMI (body mass index). BMI is a measurement that tells if you are at a healthy weight. Heart rate and blood  pressure. Body temperature. Skin for abnormal spots. Counseling Your health care provider may ask you questions about your: Past medical problems. Family's medical history. Alcohol, tobacco, and drug use. Emotional well-being. Home life and relationship well-being. Sexual activity. Diet, exercise, and sleep habits. History of falls. Memory and ability to understand (cognition). Work and work Astronomer. Access to firearms. What immunizations do I need?  Vaccines are usually given at various ages, according to a schedule. Your health care provider will recommend vaccines for you based on your age, medicalhistory, and lifestyle or other factors, such as travel or where you work. What tests do I need? Blood tests Lipid and cholesterol levels. These may be checked every 5 years, or more often depending on your overall health. Hepatitis C test. Hepatitis B test. Screening Lung cancer screening. You may have this screening every year starting at age 68 if you have a 30-pack-year history of smoking and currently smoke or have quit within the past 15 years. Colorectal cancer screening. All adults should have this screening starting at age 68 and continuing until age 36. Your health care provider may recommend screening at age 68 if you are at increased risk. You will have tests every 1-10 years, depending on your results and the type of screening test. Prostate cancer screening. Recommendations will vary depending on your family history and other risks. Genital exam to  check for testicular cancer or hernias. Diabetes screening. This is done by checking your blood sugar (glucose) after you have not eaten for a while (fasting). You may have this done every 1-3 years. Abdominal aortic aneurysm (AAA) screening. You may need this if you are a current or former smoker. STD (sexually transmitted disease) testing, if you are at risk. Follow these instructions at home: Eating and  drinking  Eat a diet that includes fresh fruits and vegetables, whole grains, lean protein, and low-fat dairy products. Limit your intake of foods with high amounts of sugar, saturated fats, and salt. Take vitamin and mineral supplements as recommended by your health care provider. Do not drink alcohol if your health care provider tells you not to drink. If you drink alcohol: Limit how much you have to 0-2 drinks a day. Be aware of how much alcohol is in your drink. In the [place] one drink equals one 12 oz bottle of beer (355 mL), one 5 oz glass of wine (148 mL), or one 1 oz glass of hard liquor (44 mL).  Lifestyle Take daily care of your teeth and gums. Brush your teeth every morning and night with fluoride toothpaste. Floss one time each day. Stay active. Exercise for at least 30 minutes 5 or more days each week. Do not use any products that contain nicotine or tobacco, such as cigarettes, e-cigarettes, and chewing tobacco. If you need help quitting, ask your health care provider. Do not use drugs. If you are sexually active, practice safe sex. Use a condom or other form of protection to prevent STIs (sexually transmitted infections). Talk with your health care provider about taking a low-dose aspirin or statin. Find healthy ways to cope with stress, such as: Meditation, yoga, or listening to music. Journaling. Talking to a trusted person. Spending time with friends and family. Safety Always wear your seat belt while driving or riding in a vehicle. Do not drive: If you have been drinking alcohol. Do not ride with someone who has been drinking. When you are tired or distracted. While texting. Wear a helmet and other protective equipment during sports activities. If you have firearms in your house, make sure you follow all gun safety procedures. What's next? Visit your health care provider once a year for an annual wellness visit. Ask your health care provider how often you should have  your eyes and teeth checked. Stay up to date on all vaccines. This information is not intended to replace advice given to you by your health care provider. Make sure you discuss any questions you have with your healthcare provider. Document Revised: 12/30/2018 Document Reviewed: 03/27/2018 Elsevier Patient Education  2022 [employer].

## 2020-11-28 NOTE — Progress Notes (Signed)
Chief Complaint:  Wesley Black is a 70 y.o. male who presents today for his annual comprehensive physical exam.    Assessment/Plan:  Chronic Problems Addressed Today: Depression, major, single episode, mild ([place]) Had lengthy discussion with patient regarding treatment options.  He would like to try therapy.  Will place referral today.  He will check in with me in a few months to let me know how this is going.  May consider trial of SSRI or Wellbutrin depending on response.  Hyperthyroidism On methimazole per endocrinology.  Check labs today.  Preventative Healthcare: Check Labs.  Discussed vaccines.  Up-to-date on cancer screening.  Patient Counseling(The following topics were reviewed and/or handout was given):  -Nutrition: Stressed importance of moderation in sodium/caffeine intake, saturated fat and cholesterol, caloric balance, sufficient intake of fresh fruits, vegetables, and fiber.  -Stressed the importance of regular exercise.   -Substance Abuse: Discussed cessation/primary prevention of tobacco, alcohol, or other drug use; driving or other dangerous activities under the influence; availability of treatment for abuse.   -Injury prevention: Discussed safety belts, safety helmets, smoke detector, smoking near bedding or upholstery.   -Sexuality: Discussed sexually transmitted diseases, partner selection, use of condoms, avoidance of unintended pregnancy and contraceptive alternatives.   -Dental health: Discussed importance of regular tooth brushing, flossing, and dental visits.  -Health maintenance and immunizations reviewed. Please refer to Health maintenance section.  Return to care in 1 year for next preventative visit.     Subjective:  HPI:  He has a FMHx of mild depression. His late mother struggled with depression while he was growing up, as well as his two siblings and himself. They have all been handled it well. 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 Since the event in November, he has been experiencing the depression.   Over the past 3 months however, he has been on a Technical sales engineer" of emotions; some days he will get up and feel great and the next day he will be back to a depressed mood. He denies ideation of self harm or difficulty getting up in the morning. He admits to getting frustrated much more easily, and notes getting frustrated over small things.  Additionally, he feels isolated and alone, and that he cannot depend on his friends. A physician who he has visited in the past he had met with again to talk about the depression issue. The physician had stated that it is likely due to a biochemical imbalance. When the patient does not have a plan or task to do for the day, he states that he struggles to get through the day.  He does not believe that his older brother has taken medication for depression treatment, and is unsure if his younger brother has. Moreover, he expresses an interest in therapy over medication, but if therapy does not work he would be willing to take medication.  The hyperthyroidism has gotten under control, but he has concerns about maintaining a healthy level as to not "go negative", so he does not enter hypothyroidism again.   He has swelling edema in his left ankle, which he has some issues managing due to a difficulty keeping track of time during work on the computer.    Lifestyle Diet: Reasonably healthy diet Exercise: Goes to the gym 1-2x a week, bicycles a lot  Depression screen PHQ 2/9 04/21/2020  Decreased Interest 0  Down, Depressed, Hopeless 1  PHQ - 2 Score 1  Altered sleeping -  Tired, decreased energy -  Change in appetite -  Feeling bad or failure about yourself  -  Trouble concentrating -  Moving slowly or fidgety/restless -  Suicidal thoughts  -  PHQ-9 Score -  Difficult doing work/chores -    Health Maintenance Due  Topic Date Due   Zoster Vaccines- Shingrix (1 of 2) Never done   COVID-19 Vaccine (4 - Booster for Moderna series) 07/02/2020   INFLUENZA VACCINE  11/14/2020     ROS: Per HPI, otherwise a complete review of systems was negative.   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   Past Surgical History:  Procedure Laterality Date   COLONOSCOPY     ENDOVENOUS ABLATION SAPHENOUS VEIN W/ LASER Right 07/28/2020   endovenous laser ablation right greater saphenous vein and stab phlebectomy < 10 incisions right leg by Gae Gallop MD    KNEE ARTHROPLASTY Right 2004   LASIK Bilateral 2001   SHOULDER SURGERY Left 1984    Family History  Problem Relation Age of Onset   Hypertension Mother    Arthritis Mother    Hyperlipidemia Mother    Prostate cancer Father    Cancer Father    Hyperlipidemia Father    Hypertension Father    Prostate cancer Brother    Obesity Brother    Hyperthyroidism Brother    Asthma Maternal Grandmother    Hyperlipidemia Maternal Grandfather    Hypertension Maternal Grandfather    Stroke Maternal Grandfather    COPD Paternal Grandfather    Colon cancer Neg Hx    Esophageal cancer Neg Hx    Rectal cancer Neg Hx    Stomach cancer Neg Hx     Medications- reviewed and updated Current Outpatient Medications  Medication Sig Dispense Refill   b complex vitamins tablet Take 1 tablet by mouth daily.     methimazole (TAPAZOLE) 5 MG tablet Take 0.5 tablets  (2.5 mg total) by mouth daily. 45 tablet 3   No current facility-administered medications for this visit.    Allergies-reviewed and updated No Known Allergies  Social History   Socioeconomic History   Marital status: Single    Spouse name: Not on file   Number of children: 0   Years of education: Master's   Highest education level: Not on file  Occupational History   Occupation: Retired  Tobacco Use   Smoking status: Never   Smokeless tobacco: Never  Vaping Use   Vaping Use: Never used  Substance and Sexual Activity   Alcohol use: Yes    Comment: 2 glasses of wine or beer per month   Drug use: No   Sexual activity: Not on file    Comment: Married  Other Topics Concern   Not on file  Social History Narrative   Lives at home, separated   Left-handed   Caffeine: 1 glass of tea per day, occasional 7 oz Pepsi (  every 2 wks)      Social Determinants of Health   Financial Resource Strain: Low Risk    Difficulty of Paying Living Expenses: Not hard at all  Food Insecurity: No Food Insecurity   Worried About Charity fundraiser in the Last Year: Never true   Arboriculturist in the Last Year: Never true  Transportation Needs: No Transportation Needs   Lack of Transportation (Medical): No   Lack of Transportation (Non-Medical): No  Physical Activity: Sufficiently Active   Days of Exercise per Week: 3 days   Minutes of Exercise per Session: 120 min  Stress: No Stress Concern Present   Feeling of Stress : Not at all  Social Connections: Moderately Isolated   Frequency of Communication with Friends and Family: Once a week   Frequency of Social Gatherings with Friends and Family: Once a week   Attends Religious Services: 1 to 4 times per year   Active Member of [employer] or Organizations: Yes   Attends Archivist Meetings: 1 to 4 times per year   Marital Status: Never married        Objective:  Physical Exam: BP 112/68   Pulse (!) 57   Temp (!) 97 F (36.1 C)  (Temporal)   Ht '6\' 3"'  (1.905 m)   Wt 196 lb 12.8 oz (89.3 kg)   SpO2 100%   BMI 24.60 kg/m   Body mass index is 24.6 kg/m. Wt Readings from Last 3 Encounters:  11/28/20 196 lb 12.8 oz (89.3 kg)  10/11/20 201 lb 6.4 oz (91.4 kg)  09/29/20 199 lb 12.8 oz (90.6 kg)   Gen: NAD, resting comfortably  HEENT: TMs normal bilaterally. OP clear. No thyromegaly noted.  CV: RRR with no murmurs appreciated Pulm: NWOB, CTAB with no crackles, wheezes, or rhonchi GI: Normal bowel sounds present. Soft, Nontender, Nondistended. MSK: no edema, cyanosis, or clubbing noted Skin: warm, dry Neuro: CN2-12 grossly intact. Strength 5/5 in upper and lower extremities. Reflexes symmetric and intact bilaterally.  Psych: Normal affect and thought content     I,Jordan Kelly,acting as a scribe for Dimas Chyle, MD.,have documented all relevant documentation on the behalf of Dimas Chyle, MD,as directed by  Dimas Chyle, MD while in the presence of Dimas Chyle, MD.  I, Dimas Chyle, MD, have reviewed all documentation for this visit. The documentation on 11/28/20 for the exam, diagnosis, procedures, and orders are all accurate and complete.  Algis Greenhouse. Jerline Pain, MD 11/28/2020 9:13 AM

## 2020-11-28 NOTE — Assessment & Plan Note (Signed)
On methimazole per endocrinology.  Check labs today.

## 2020-11-28 NOTE — Assessment & Plan Note (Signed)
Had lengthy discussion with patient regarding treatment options.  He would like to try therapy.  Will place referral today.  He will check in with me in a few months to let me know how this is going.  May consider trial of SSRI or Wellbutrin depending on response.

## 2020-11-29 NOTE — Progress Notes (Signed)
Please inform patient of the following:  Labs are all stable.  Do not make any adjustments to his treatment plan at this time.  She continue working on diet and exercise and we can recheck in a year.

## 2021-01-03 ENCOUNTER — Telehealth (INDEPENDENT_AMBULATORY_CARE_PROVIDER_SITE_OTHER): Payer: Medicare HMO | Admitting: Family Medicine

## 2021-01-03 ENCOUNTER — Encounter: Payer: Self-pay | Admitting: Family Medicine

## 2021-01-03 VITALS — Wt 193.8 lb

## 2021-01-03 DIAGNOSIS — R059 Cough, unspecified: Secondary | ICD-10-CM | POA: Diagnosis not present

## 2021-01-03 MED ORDER — ALBUTEROL SULFATE HFA 108 (90 BASE) MCG/ACT IN AERS: 2.0000 | INHALATION_SPRAY | Freq: Four times a day (QID) | RESPIRATORY_TRACT | 2 refills | Status: DC | PRN

## 2021-01-03 MED ORDER — PREDNISONE 20 MG PO TABS: 40.0000 mg | ORAL_TABLET | Freq: Every day | ORAL | 0 refills | Status: DC

## 2021-01-03 MED ORDER — BENZONATATE 200 MG PO CAPS: 200.0000 mg | ORAL_CAPSULE | Freq: Two times a day (BID) | ORAL | 0 refills | Status: DC | PRN

## 2021-01-03 NOTE — Progress Notes (Signed)
Virtual Visit via Video Note  I connected with [name]  on 01/03/21 at 12:20 PM EDT by a video enabled telemedicine application and verified that I am speaking with the correct person using two identifiers.  Location patient: home, [place] Location provider:work or home office Persons participating in the virtual visit: patient, provider  I discussed the limitations of evaluation and management by telemedicine and the availability of in person appointments. The patient expressed understanding and agreed to proceed.   HPI:  Acute telemedicine visit for Cough and sinus issues: -Onset: about 9 days ago -Symptoms include: sinus congestion, cough, now mainly just a cough and not much sputum production -reports when he gets this way he has to have prednisone or it does not go away, mild wheezing if takes a very deep breath -has not been cycling since this started -Denies: fever, CP, SOB, NVD, body aches, inability to eat/drink/get out of bed -Has tried:musinex, nyquil -Pertinent past medical history:see below; reports has been given an inhaler in the past due to lengthy symptoms following respiratory infections -Pertinent medication allergies: No Known Allergies -COVID-19 vaccine status: 2 doses and had a booster  ROS: See pertinent positives and negatives per HPI.  Past Medical History:  Diagnosis Date   DVT (deep venous thrombosis) ([facility])    Fracture    Multiple    Past Surgical History:  Procedure Laterality Date   COLONOSCOPY     ENDOVENOUS ABLATION SAPHENOUS VEIN W/ LASER Right 07/28/2020   endovenous laser ablation right greater saphenous vein and stab phlebectomy < 10 incisions right leg by Cari Caraway MD    KNEE ARTHROPLASTY Right 2004   LASIK Bilateral 2001   SHOULDER SURGERY Left 1984     Current Outpatient Medications:    albuterol (VENTOLIN HFA) 108 (90 Base) MCG/ACT inhaler, Inhale 2 puffs into the lungs every 6 (six) hours as needed for wheezing or shortness of breath.,  Disp: 8 g, Rfl: 2   b complex vitamins tablet, Take 1 tablet by mouth daily., Disp: , Rfl:    benzonatate (TESSALON) 200 MG capsule, Take 1 capsule (200 mg total) by mouth 2 (two) times daily as needed for cough., Disp: 20 capsule, Rfl: 0   methimazole (TAPAZOLE) 5 MG tablet, Take 0.5 tablets (2.5 mg total) by mouth daily., Disp: 45 tablet, Rfl: 3   predniSONE (DELTASONE) 20 MG tablet, Take 2 tablets (40 mg total) by mouth daily with breakfast., Disp: 8 tablet, Rfl: 0  EXAM:  VITALS per patient if applicable:  GENERAL: alert, oriented, appears well and in no acute distress  HEENT: atraumatic, conjunttiva clear, no obvious abnormalities on inspection of external nose and ears  NECK: normal movements of the head and neck  LUNGS: on inspection no signs of respiratory distress, breathing rate appears normal, no obvious gross SOB, gasping or wheezing  CV: no obvious cyanosis  MS: moves all visible extremities without noticeable abnormality  PSYCH/NEURO: pleasant and cooperative, no obvious depression or anxiety, speech and thought processing grossly intact  ASSESSMENT AND PLAN:  Discussed the following assessment and plan:  Cough  -we discussed possible serious and likely etiologies, options for evaluation and workup, limitations of telemedicine visit vs in person visit, treatment, treatment risks and precautions. Pt is agreeable to treatment via telemedicine at this moment. Query viral initial infection vs other. He possibly has undiagnosed underlying asthma triggered by viral infections given his reported hx. He opted for trial alb q 6 hours prn and cough medication with prednisone burst if not  improving. Does not seem to have symptoms of bacterial infection, but did advised to seek prompt in person care if worsening, new symptoms arise, or if is not improving with treatment. Discussed options for inperson care if PCP office not available. Did let this patient know that I only do  telemedicine on Tuesdays and Thursdays for [place]. Advised to schedule follow up visit with PCP or [facility] if any further questions or concerns to avoid delays in care.   I discussed the assessment and treatment plan with the patient. The patient was provided an opportunity to ask questions and all were answered. The patient agreed with the plan and demonstrated an understanding of the instructions.     Terressa Koyanagi, DO

## 2021-01-03 NOTE — Patient Instructions (Signed)
-  I sent the medication(s) we discussed to your pharmacy: Meds ordered this encounter  Medications   albuterol (VENTOLIN HFA) 108 (90 Base) MCG/ACT inhaler    Sig: Inhale 2 puffs into the lungs every 6 (six) hours as needed for wheezing or shortness of breath.    Dispense:  8 g    Refill:  2   benzonatate (TESSALON) 200 MG capsule    Sig: Take 1 capsule (200 mg total) by mouth 2 (two) times daily as needed for cough.    Dispense:  20 capsule    Refill:  0   predniSONE (DELTASONE) 20 MG tablet    Sig: Take 2 tablets (40 mg total) by mouth daily with breakfast.    Dispense:  8 tablet    Refill:  0     I hope you are feeling better soon!  Seek in person care promptly if your symptoms worsen, new concerns arise or you are not improving with treatment.  It was nice to meet you today. I help [facility] out with telemedicine visits on Tuesdays and Thursdays and am available for visits on those days. If you have any concerns or questions following this visit please schedule a follow up visit with your Primary Care doctor or seek care at a local urgent care clinic to avoid delays in care.

## 2021-02-10 ENCOUNTER — Ambulatory Visit (INDEPENDENT_AMBULATORY_CARE_PROVIDER_SITE_OTHER): Payer: Medicare HMO | Admitting: Endocrinology

## 2021-02-10 ENCOUNTER — Other Ambulatory Visit: Payer: Self-pay

## 2021-02-10 VITALS — BP 120/76 | HR 60 | Ht 75.0 in | Wt 203.2 lb

## 2021-02-10 DIAGNOSIS — E0590 Thyrotoxicosis, unspecified without thyrotoxic crisis or storm: Secondary | ICD-10-CM

## 2021-02-10 LAB — TSH: TSH: 2.67 u[IU]/mL (ref 0.35–5.50)

## 2021-02-10 LAB — T4, FREE: Free T4: 1.31 ng/dL (ref 0.60–1.60)

## 2021-02-10 NOTE — Progress Notes (Signed)
Subjective:    Patient ID: Wesley Black, male    DOB: 05-May-1950, 70 y.o.   MRN: [id number]  HPI Pt returns for f/u of hyperthyroidism (dx'ed 2021; he chose tapazole rx; he has never had thyroid imaging).  pt states he feels well in general, except for fatigue.  He requests to recheck TFT today.   Past Medical History:  Diagnosis Date   DVT (deep venous thrombosis) (HCC)    Fracture    Multiple    Past Surgical History:  Procedure Laterality Date   COLONOSCOPY     ENDOVENOUS ABLATION SAPHENOUS VEIN W/ LASER Right 07/28/2020   endovenous laser ablation right greater saphenous vein and stab phlebectomy < 10 incisions right leg by Cari Caraway MD    KNEE ARTHROPLASTY Right 2004   LASIK Bilateral 2001   SHOULDER SURGERY Left 1984    Social History   Socioeconomic History   Marital status: Single    Spouse name: Not on file   Number of children: 0   Years of education: Master's   Highest education level: Not on file  Occupational History   Occupation: Retired  Tobacco Use   Smoking status: Never   Smokeless tobacco: Never  Vaping Use   Vaping Use: Never used  Substance and Sexual Activity   Alcohol use: Yes    Comment: 2 glasses of wine or beer per month   Drug use: No   Sexual activity: Not on file    Comment: Married  Other Topics Concern   Not on file  Social History Narrative   Lives at home, separated   Left-handed   Caffeine: 1 glass of tea per day, occasional 7 oz Pepsi (every 2 wks)      Social Determinants of Health   Financial Resource Strain: Low Risk    Difficulty of Paying Living Expenses: Not hard at all  Food Insecurity: No Food Insecurity   Worried About Programme researcher, broadcasting/film/video in the Last Year: Never true   Ran Out of Food in the Last Year: Never true  Transportation Needs: No Transportation Needs   Lack of Transportation (Medical): No   Lack of Transportation (Non-Medical): No  Physical Activity: Sufficiently Active   Days of Exercise per  Week: 3 days   Minutes of Exercise per Session: 120 min  Stress: No Stress Concern Present   Feeling of Stress : Not at all  Social Connections: Moderately Isolated   Frequency of Communication with Friends and Family: Once a week   Frequency of Social Gatherings with Friends and Family: Once a week   Attends Religious Services: 1 to 4 times per year   Active Member of [employer] or Organizations: Yes   Attends Banker Meetings: 1 to 4 times per year   Marital Status: Never married  Catering manager Violence: Not At Risk   Fear of Current or Ex-Partner: No   Emotionally Abused: No   Physically Abused: No   Sexually Abused: No    Current Outpatient Medications on File Prior to Visit  Medication Sig Dispense Refill   b complex vitamins tablet Take 1 tablet by mouth daily.     methimazole (TAPAZOLE) 5 MG tablet Take 0.5 tablets (2.5 mg total) by mouth daily. 45 tablet 3   albuterol (VENTOLIN HFA) 108 (90 Base) MCG/ACT inhaler Inhale 2 puffs into the lungs every 6 (six) hours as needed for wheezing or shortness of breath. 8 g 2   benzonatate (TESSALON) 200 MG capsule  Take 1 capsule (200 mg total) by mouth 2 (two) times daily as needed for cough. 20 capsule 0   predniSONE (DELTASONE) 20 MG tablet Take 2 tablets (40 mg total) by mouth daily with breakfast. 8 tablet 0   No current facility-administered medications on file prior to visit.    No Known Allergies  Family History  Problem Relation Age of Onset   Hypertension Mother    Arthritis Mother    Hyperlipidemia Mother    Prostate cancer Father    Cancer Father    Hyperlipidemia Father    Hypertension Father    Prostate cancer Brother    Obesity Brother    Hyperthyroidism Brother    Asthma Maternal Grandmother    Hyperlipidemia Maternal Grandfather    Hypertension Maternal Grandfather    Stroke Maternal Grandfather    COPD Paternal Grandfather    Colon cancer Neg Hx    Esophageal cancer Neg Hx    Rectal cancer Neg  Hx    Stomach cancer Neg Hx     BP 120/76 (BP Location: Right Arm, Patient Position: Sitting, Cuff Size: Normal)   Pulse 60   Ht 6\' 3"  (1.905 m)   Wt 203 lb 3.2 oz (92.2 kg)   SpO2 98%   BMI 25.40 kg/m    Review of Systems Denies fever    Objective:   Physical Exam NECK: There is no palpable thyroid enlargement.  No thyroid nodule is palpable.  No palpable lymphadenopathy at the anterior neck.   Lab Results  Component Value Date   TSH 2.67 02/10/2021   T4TOTAL 7.7 09/29/2020      Assessment & Plan:  Hyperthyroidism: well-controlled.  Please continue the same methimazole

## 2021-02-10 NOTE — Patient Instructions (Addendum)
Blood tests are requested for you today.  We'll let you know about the results.   If ever you have fever while taking methimazole, stop it and call us, even if the reason is obvious, because of the risk of a rare side-effect.  It is best to never miss the medication.  However, if you do miss it, next best is to double up the next time.   Please come back for a follow-up appointment in 4 months.   

## 2021-02-21 ENCOUNTER — Other Ambulatory Visit: Payer: Self-pay

## 2021-02-21 ENCOUNTER — Ambulatory Visit (INDEPENDENT_AMBULATORY_CARE_PROVIDER_SITE_OTHER): Payer: Medicare HMO

## 2021-02-21 DIAGNOSIS — Z23 Encounter for immunization: Secondary | ICD-10-CM | POA: Diagnosis not present

## 2021-03-21 DIAGNOSIS — H43813 Vitreous degeneration, bilateral: Secondary | ICD-10-CM | POA: Diagnosis not present

## 2021-04-24 DIAGNOSIS — M545 Low back pain, unspecified: Secondary | ICD-10-CM | POA: Diagnosis not present

## 2021-04-24 DIAGNOSIS — M25551 Pain in right hip: Secondary | ICD-10-CM | POA: Diagnosis not present

## 2021-04-27 ENCOUNTER — Other Ambulatory Visit: Payer: Self-pay

## 2021-04-27 ENCOUNTER — Ambulatory Visit (INDEPENDENT_AMBULATORY_CARE_PROVIDER_SITE_OTHER): Payer: No Typology Code available for payment source

## 2021-04-27 VITALS — BP 118/64 | HR 77 | Temp 97.7°F | Wt 202.0 lb

## 2021-04-27 DIAGNOSIS — Z0000 Encounter for general adult medical examination without abnormal findings: Secondary | ICD-10-CM | POA: Diagnosis not present

## 2021-04-27 NOTE — Progress Notes (Addendum)
Subjective:   Wesley Black is a 71 y.o. male who presents for Medicare Annual/Subsequent preventive examination.  Review of Systems     Cardiac Risk Factors include: advanced age (>22men, >17 women);male gender     Objective:    Today's Vitals   04/27/21 0757  BP: 118/64  Pulse: 77  Temp: 97.7 F (36.5 C)  SpO2: 96%  Weight: 202 lb (91.6 kg)   Body mass index is 25.25 kg/m.  Advanced Directives 04/27/2021 04/21/2020 03/06/2019  Does Patient Have a Medical Advance Directive? Yes Yes Yes  Type of Advance Directive Living will Healthcare Power of [place];Living will -  Copy of Healthcare Power of Attorney in Chart? - No - copy requested -    Current Medications (verified) Outpatient Encounter Medications as of 04/27/2021  Medication Sig   b complex vitamins tablet Take 1 tablet by mouth daily.   methimazole (TAPAZOLE) 5 MG tablet Take 0.5 tablets (2.5 mg total) by mouth daily.   PFIZER COVID-19 VAC BIVALENT injection    [DISCONTINUED] albuterol (VENTOLIN HFA) 108 (90 Base) MCG/ACT inhaler Inhale 2 puffs into the lungs every 6 (six) hours as needed for wheezing or shortness of breath.   [DISCONTINUED] benzonatate (TESSALON) 200 MG capsule Take 1 capsule (200 mg total) by mouth 2 (two) times daily as needed for cough.   [DISCONTINUED] predniSONE (DELTASONE) 20 MG tablet Take 2 tablets (40 mg total) by mouth daily with breakfast.   No facility-administered encounter medications on file as of 04/27/2021.    Allergies (verified) Patient has no known allergies.   History: Past Medical History:  Diagnosis Date   DVT (deep venous thrombosis) (HCC)    Fracture    Multiple   Past Surgical History:  Procedure Laterality Date   COLONOSCOPY     ENDOVENOUS ABLATION SAPHENOUS VEIN W/ LASER Right 07/28/2020   endovenous laser ablation right greater saphenous vein and stab phlebectomy < 10 incisions right leg by Cari Caraway MD    KNEE ARTHROPLASTY Right 2004   LASIK Bilateral  2001   SHOULDER SURGERY Left 1984   Family History  Problem Relation Age of Onset   Hypertension Mother    Arthritis Mother    Hyperlipidemia Mother    Prostate cancer Father    Cancer Father    Hyperlipidemia Father    Hypertension Father    Prostate cancer Brother    Obesity Brother    Hyperthyroidism Brother    Asthma Maternal Grandmother    Hyperlipidemia Maternal Grandfather    Hypertension Maternal Grandfather    Stroke Maternal Grandfather    COPD Paternal Grandfather    Colon cancer Neg Hx    Esophageal cancer Neg Hx    Rectal cancer Neg Hx    Stomach cancer Neg Hx    Social History   Socioeconomic History   Marital status: Single    Spouse name: Not on file   Number of children: 0   Years of education: Master's   Highest education level: Not on file  Occupational History   Occupation: Retired  Tobacco Use   Smoking status: Never   Smokeless tobacco: Never  Vaping Use   Vaping Use: Never used  Substance and Sexual Activity   Alcohol use: Yes    Comment: 2 glasses of wine or beer per month   Drug use: No   Sexual activity: Not on file    Comment: Married  Other Topics Concern   Not on file  Social History Narrative  Lives at home, separated   Left-handed   Caffeine: 1 glass of tea per day, occasional 7 oz Pepsi (every 2 wks)      Social Determinants of Health   Financial Resource Strain: Low Risk    Difficulty of Paying Living Expenses: Not hard at all  Food Insecurity: No Food Insecurity   Worried About Programme researcher, broadcasting/film/video in the Last Year: Never true   Ran Out of Food in the Last Year: Never true  Transportation Needs: No Transportation Needs   Lack of Transportation (Medical): No   Lack of Transportation (Non-Medical): No  Physical Activity: Sufficiently Active   Days of Exercise per Week: 3 days   Minutes of Exercise per Session: 90 min  Stress: No Stress Concern Present   Feeling of Stress : Not at all  Social Connections: Moderately  Integrated   Frequency of Communication with Friends and Family: Once a week   Frequency of Social Gatherings with Friends and Family: More than three times a week   Attends Religious Services: More than 4 times per year   Active Member of [employer] or Organizations: Yes   Attends Banker Meetings: 1 to 4 times per year   Marital Status: Never married    Tobacco Counseling Counseling given: Not Answered   Clinical Intake:  Pre-visit preparation completed: Yes  Pain : No/denies pain     BMI - recorded: 25.25 Nutritional Status: BMI 25 -29 Overweight Nutritional Risks: None Diabetes: No  How often do you need to have someone help you when you read instructions, pamphlets, or other written materials from your doctor or pharmacy?: 1 - Never  Diabetic?no  Interpreter Needed?: No  Information entered by :: Lanier Ensign, LPN   Activities of Daily Living In your present state of health, do you have any difficulty performing the following activities: 04/27/2021  Hearing? Y  Comment right ear frequency  Vision? N  Difficulty concentrating or making decisions? N  Walking or climbing stairs? N  Dressing or bathing? N  Doing errands, shopping? N  Preparing Food and eating ? N  Using the Toilet? N  In the past six months, have you accidently leaked urine? N  Do you have problems with loss of bowel control? N  Managing your Medications? N  Managing your Finances? N  Housekeeping or managing your Housekeeping? N  Some recent data might be hidden    Patient Care Team: Ardith Dark, MD as PCP - General (Family Medicine) Jethro Bastos, MD as Consulting Physician (Family Medicine) Etta Quill, MD as Attending Physician (Ophthalmology) Etta Quill, MD as Attending Physician (Ophthalmology)  Indicate any recent Medical Services you may have received from other than [facility] providers in the past year (date may be approximate).     Assessment:   This  is a routine wellness examination for Wesley Black.  Hearing/Vision screen Hearing Screening - Comments:: Pt stated mild right ear frequency loss Vision Screening - Comments:: Pt follows up with Dr Heather [place] eye   Dietary issues and exercise activities discussed: Current Exercise Habits: Home exercise routine, Type of exercise: Other - see comments (gym and cycling weather permits), Time (Minutes): > 60, Frequency (Times/Week): 3, Weekly Exercise (Minutes/Week): 0   Goals Addressed             This Visit's Progress    Patient Stated       Continue to remain healthy        Depression Screen PHQ  2/9 Scores 04/27/2021 11/28/2020 04/21/2020 03/06/2019 11/18/2018 04/14/2018  PHQ - 2 Score 0 2 1 0 1 0  PHQ- 9 Score - 3 - - 1 -    Fall Risk Fall Risk  04/27/2021 04/21/2020 03/06/2019  Falls in the past year? 0 0 0  Number falls in past yr: 0 0 -  Injury with Fall? 0 0 0  Follow up Falls prevention discussed Falls prevention discussed Education provided;Falls prevention discussed;Falls evaluation completed    FALL RISK PREVENTION PERTAINING TO THE HOME:  Any stairs in or around the home? Yes  If so, are there any without handrails? No  Home free of loose throw rugs in walkways, pet beds, electrical cords, etc? Yes  Adequate lighting in your home to reduce risk of falls? Yes   ASSISTIVE DEVICES UTILIZED TO PREVENT FALLS:  Life alert? No  Use of a cane, walker or w/c? No  Grab bars in the bathroom? No  Shower chair or bench in shower? No  Elevated toilet seat or a handicapped toilet? No   TIMED UP AND GO:  Was the test performed? No .  Cognitive Function:     6CIT Screen 04/27/2021 04/21/2020  What Year? 0 points 0 points  What month? 0 points 0 points  What time? 0 points -  Count back from 20 0 points 0 points  Months in reverse 0 points 0 points  Repeat phrase 0 points 0 points  Total Score 0 -    Immunizations Immunization History  Administered Date(s) Administered    Fluad Quad(high Dose 65+) 03/06/2019, 02/16/2020, 02/21/2021   Influenza,inj,Quad PF,6+ Mos 12/12/2017   Moderna Sars-Covid-2 Vaccination 05/12/2019, 06/09/2019   PFIZER(Purple Top)SARS-COV-2 Vaccination 03/04/2020   Pneumococcal Conjugate-13 12/12/2017   Pneumococcal Polysaccharide-23 03/06/2019   Tdap 11/18/2018    TDAP status: Up to date  Flu Vaccine status: Up to date  Pneumococcal vaccine status: Up to date  Covid-19 vaccine status: Completed vaccines  Qualifies for Shingles Vaccine? Yes   Zostavax completed No   Shingrix Completed?: No.    Education has been provided regarding the importance of this vaccine. Patient has been advised to call insurance company to determine out of pocket expense if they have not yet received this vaccine. Advised may also receive vaccine at local pharmacy or Health Dept. Verbalized acceptance and understanding.  Screening Tests Health Maintenance  Topic Date Due   Zoster Vaccines- Shingrix (1 of 2) Never done   COVID-19 Vaccine (4 - Booster for Moderna series) 04/29/2020   TETANUS/TDAP  11/17/2028   COLONOSCOPY (Pts [id number] Insurance coverage will need to be confirmed)  03/23/2029   Pneumonia Vaccine 8265+ Years old  Completed   INFLUENZA VACCINE  Completed   Hepatitis C Screening  Completed   HPV VACCINES  Aged Out   Fecal DNA (Cologuard)  Discontinued    Health Maintenance  Health Maintenance Due  Topic Date Due   Zoster Vaccines- Shingrix (1 of 2) Never done   COVID-19 Vaccine (4 - Booster for Moderna series) 04/29/2020    Colorectal cancer screening: Type of screening: Colonoscopy. Completed 03/24/19. Repeat every 10 years   Additional Screening:  Hepatitis C Screening: Completed 11/18/18  Vision Screening: Recommended annual ophthalmology exams for early detection of glaucoma and other disorders of the eye. Is the patient up to date with their annual eye exam?  Yes  Who is the provider or what is the name of the office in which  the patient attends annual eye exams? Dr Natalia LeatherwoodKatherine  [place]  If pt is not established with a provider, would they like to be referred to a provider to establish care? No .   Dental Screening: Recommended annual dental exams for proper oral hygiene  Community Resource Referral / Chronic Care Management: CRR required this visit?  No   CCM required this visit?  No      Plan:     I have personally reviewed and noted the following in the patients chart:   Medical and social history Use of alcohol, tobacco or illicit drugs  Current medications and supplements including opioid prescriptions. Patient is not currently taking opioid prescriptions. Functional ability and status Nutritional status Physical activity Advanced directives List of other physicians Hospitalizations, surgeries, and ER visits in previous 12 months Vitals Screenings to include cognitive, depression, and falls Referrals and appointments  In addition, I have reviewed and discussed with patient certain preventive protocols, quality metrics, and best practice recommendations. A written personalized care plan for preventive services as well as general preventive health recommendations were provided to patient.     Marzella Schleinina H Ovida Delagarza, LPN   1/61/09601/03/2022   Nurse Notes: none

## 2021-04-27 NOTE — Patient Instructions (Signed)
Mr. Wesley Black , Thank you for taking time to come for your Medicare Wellness Visit. I appreciate your ongoing commitment to your health goals. Please review the following plan we discussed and let me know if I can assist you in the future.   Screening recommendations/referrals: Colonoscopy: Done 03/24/19 repeat every 10 years  Recommended yearly ophthalmology/optometry visit for glaucoma screening and checkup Recommended yearly dental visit for hygiene and checkup  Vaccinations: Influenza vaccine: Done 02/21/21 repeat every year Pneumococcal vaccine: Up to date Tdap vaccine: Done 11/18/18 repeat every 10 years  Shingles vaccine: Shingrix discussed. Please contact your pharmacy for coverage information.    Covid-19: Completed 1/26 & 06/09/19  Advanced directives: Please bring a copy of your health care power of attorney and living will to the office at your convenience.  Conditions/risks identified: Maintain healthy state   Next appointment: Follow up in one year for your annual wellness visit.   Preventive Care 69 Years and Older, Male Preventive care refers to lifestyle choices and visits with your health care provider that can promote health and wellness. What does preventive care include? A yearly physical exam. This is also called an annual well check. Dental exams once or twice a year. Routine eye exams. Ask your health care provider how often you should have your eyes checked. Personal lifestyle choices, including: Daily care of your teeth and gums. Regular physical activity. Eating a healthy diet. Avoiding tobacco and drug use. Limiting alcohol use. Practicing safe sex. Taking low doses of aspirin every day. Taking vitamin and mineral supplements as recommended by your health care provider. What happens during an annual well check? The services and screenings done by your health care provider during your annual well check will depend on your age, overall health, lifestyle risk  factors, and family history of disease. Counseling  Your health care provider may ask you questions about your: Alcohol use. Tobacco use. Drug use. Emotional well-being. Home and relationship well-being. Sexual activity. Eating habits. History of falls. Memory and ability to understand (cognition). Work and work Statistician. Screening  You may have the following tests or measurements: Height, weight, and BMI. Blood pressure. Lipid and cholesterol levels. These may be checked every 5 years, or more frequently if you are over 49 years old. Skin check. Lung cancer screening. You may have this screening every year starting at age 14 if you have a 30-pack-year history of smoking and currently smoke or have quit within the past 15 years. Fecal occult blood test (FOBT) of the stool. You may have this test every year starting at age 23. Flexible sigmoidoscopy or colonoscopy. You may have a sigmoidoscopy every 5 years or a colonoscopy every 10 years starting at age 31. Prostate cancer screening. Recommendations will vary depending on your family history and other risks. Hepatitis C blood test. Hepatitis B blood test. Sexually transmitted disease (STD) testing. Diabetes screening. This is done by checking your blood sugar (glucose) after you have not eaten for a while (fasting). You may have this done every 1-3 years. Abdominal aortic aneurysm (AAA) screening. You may need this if you are a current or former smoker. Osteoporosis. You may be screened starting at age 68 if you are at high risk. Talk with your health care provider about your test results, treatment options, and if necessary, the need for more tests. Vaccines  Your health care provider may recommend certain vaccines, such as: Influenza vaccine. This is recommended every year. Tetanus, diphtheria, and acellular pertussis (Tdap, Td) vaccine. You  may need a Td booster every 10 years. Zoster vaccine. You may need this after age  28. Pneumococcal 13-valent conjugate (PCV13) vaccine. One dose is recommended after age 27. Pneumococcal polysaccharide (PPSV23) vaccine. One dose is recommended after age 43. Talk to your health care provider about which screenings and vaccines you need and how often you need them. This information is not intended to replace advice given to you by your health care provider. Make sure you discuss any questions you have with your health care provider. Document Released: 04/29/2015 Document Revised: 12/21/2015 Document Reviewed: 02/01/2015 Elsevier Interactive Patient Education  2017 [employer] Prevention in the Home Falls can cause injuries. They can happen to people of all ages. There are many things you can do to make your home safe and to help prevent falls. What can I do on the outside of my home? Regularly fix the edges of walkways and driveways and fix any cracks. Remove anything that might make you trip as you walk through a door, such as a raised step or threshold. Trim any bushes or trees on the path to your home. Use bright outdoor lighting. Clear any walking paths of anything that might make someone trip, such as rocks or tools. Regularly check to see if handrails are loose or broken. Make sure that both sides of any steps have handrails. Any raised decks and porches should have guardrails on the edges. Have any leaves, snow, or ice cleared regularly. Use sand or salt on walking paths during winter. Clean up any spills in your garage right away. This includes oil or grease spills. What can I do in the bathroom? Use night lights. Install grab bars by the toilet and in the tub and shower. Do not use towel bars as grab bars. Use non-skid mats or decals in the tub or shower. If you need to sit down in the shower, use a plastic, non-slip stool. Keep the floor dry. Clean up any water that spills on the floor as soon as it happens. Remove soap buildup in the tub or shower  regularly. Attach bath mats securely with double-sided non-slip rug tape. Do not have throw rugs and other things on the floor that can make you trip. What can I do in the bedroom? Use night lights. Make sure that you have a light by your bed that is easy to reach. Do not use any sheets or blankets that are too big for your bed. They should not hang down onto the floor. Have a firm chair that has side arms. You can use this for support while you get dressed. Do not have throw rugs and other things on the floor that can make you trip. What can I do in the kitchen? Clean up any spills right away. Avoid walking on wet floors. Keep items that you use a lot in easy-to-reach places. If you need to reach something above you, use a strong step stool that has a grab bar. Keep electrical cords out of the way. Do not use floor polish or wax that makes floors slippery. If you must use wax, use non-skid floor wax. Do not have throw rugs and other things on the floor that can make you trip. What can I do with my stairs? Do not leave any items on the stairs. Make sure that there are handrails on both sides of the stairs and use them. Fix handrails that are broken or loose. Make sure that handrails are as long as the  stairways. Check any carpeting to make sure that it is firmly attached to the stairs. Fix any carpet that is loose or worn. Avoid having throw rugs at the top or bottom of the stairs. If you do have throw rugs, attach them to the floor with carpet tape. Make sure that you have a light switch at the top of the stairs and the bottom of the stairs. If you do not have them, ask someone to add them for you. What else can I do to help prevent falls? Wear shoes that: Do not have high heels. Have rubber bottoms. Are comfortable and fit you well. Are closed at the toe. Do not wear sandals. If you use a stepladder: Make sure that it is fully opened. Do not climb a closed stepladder. Make sure that  both sides of the stepladder are locked into place. Ask someone to hold it for you, if possible. Clearly mark and make sure that you can see: Any grab bars or handrails. First and last steps. Where the edge of each step is. Use tools that help you move around (mobility aids) if they are needed. These include: Canes. Walkers. Scooters. Crutches. Turn on the lights when you go into a dark area. Replace any light bulbs as soon as they burn out. Set up your furniture so you have a clear path. Avoid moving your furniture around. If any of your floors are uneven, fix them. If there are any pets around you, be aware of where they are. Review your medicines with your doctor. Some medicines can make you feel dizzy. This can increase your chance of falling. Ask your doctor what other things that you can do to help prevent falls. This information is not intended to replace advice given to you by your health care provider. Make sure you discuss any questions you have with your health care provider. Document Released: 01/27/2009 Document Revised: 09/08/2015 Document Reviewed: 05/07/2014 Elsevier Interactive Patient Education  2017 [employer].

## 2021-06-14 ENCOUNTER — Encounter: Payer: Self-pay | Admitting: Endocrinology

## 2021-06-14 ENCOUNTER — Other Ambulatory Visit: Payer: Self-pay

## 2021-06-14 ENCOUNTER — Ambulatory Visit (INDEPENDENT_AMBULATORY_CARE_PROVIDER_SITE_OTHER): Payer: No Typology Code available for payment source | Admitting: Endocrinology

## 2021-06-14 VITALS — BP 114/64 | HR 68 | Ht 75.0 in | Wt 207.8 lb

## 2021-06-14 DIAGNOSIS — E0590 Thyrotoxicosis, unspecified without thyrotoxic crisis or storm: Secondary | ICD-10-CM | POA: Diagnosis not present

## 2021-06-14 LAB — TSH: TSH: 2.82 u[IU]/mL (ref 0.35–5.50)

## 2021-06-14 LAB — T4, FREE: Free T4: 1.04 ng/dL (ref 0.60–1.60)

## 2021-06-14 NOTE — Patient Instructions (Addendum)
Blood tests are requested for you today.  We'll let you know about the results.   If ever you have fever while taking methimazole, stop it and call us, even if the reason is obvious, because of the risk of a rare side-effect.  It is best to never miss the medication.  However, if you do miss it, next best is to double up the next time.   Please come back for a follow-up appointment in 4 months.   

## 2021-06-14 NOTE — Progress Notes (Signed)
? ?Subjective:  ? ? Patient ID: Wesley Black, male    DOB: 01/29/1951, 71 y.o.   MRN: [id number] ? ?HPI ?Pt returns for f/u of hyperthyroidism (dx'ed 2021; he chose tapazole rx; he has never had thyroid imaging).  pt states he feels well in general.  ?Past Medical History:  ?Diagnosis Date  ? DVT (deep venous thrombosis) (HCC)   ? Fracture   ? Multiple  ? ? ?Past Surgical History:  ?Procedure Laterality Date  ? COLONOSCOPY    ? ENDOVENOUS ABLATION SAPHENOUS VEIN W/ LASER Right 07/28/2020  ? endovenous laser ablation right greater saphenous vein and stab phlebectomy < 10 incisions right leg by Cari Caraway MD   ? KNEE ARTHROPLASTY Right 2004  ? LASIK Bilateral 2001  ? SHOULDER SURGERY Left 1984  ? ? ?Social History  ? ?Socioeconomic History  ? Marital status: Single  ?  Spouse name: Not on file  ? Number of children: 0  ? Years of education: Master's  ? Highest education level: Not on file  ?Occupational History  ? Occupation: Retired  ?Tobacco Use  ? Smoking status: Never  ? Smokeless tobacco: Never  ?Vaping Use  ? Vaping Use: Never used  ?Substance and Sexual Activity  ? Alcohol use: Yes  ?  Comment: 2 glasses of wine or beer per month  ? Drug use: No  ? Sexual activity: Not on file  ?  Comment: Married  ?Other Topics Concern  ? Not on file  ?Social History Narrative  ? Lives at home, separated  ? Left-handed  ? Caffeine: 1 glass of tea per day, occasional 7 oz Pepsi (every 2 wks)  ?   ? ?Social Determinants of Health  ? ?Financial Resource Strain: Low Risk   ? Difficulty of Paying Living Expenses: Not hard at all  ?Food Insecurity: No Food Insecurity  ? Worried About Programme researcher, broadcasting/film/video in the Last Year: Never true  ? Ran Out of Food in the Last Year: Never true  ?Transportation Needs: No Transportation Needs  ? Lack of Transportation (Medical): No  ? Lack of Transportation (Non-Medical): No  ?Physical Activity: Sufficiently Active  ? Days of Exercise per Week: 3 days  ? Minutes of Exercise per Session: 90 min   ?Stress: No Stress Concern Present  ? Feeling of Stress : Not at all  ?Social Connections: Moderately Integrated  ? Frequency of Communication with Friends and Family: Once a week  ? Frequency of Social Gatherings with Friends and Family: More than three times a week  ? Attends Religious Services: More than 4 times per year  ? Active Member of Clubs or Organizations: Yes  ? Attends Banker Meetings: 1 to 4 times per year  ? Marital Status: Never married  ?Intimate Partner Violence: Not At Risk  ? Fear of Current or Ex-Partner: No  ? Emotionally Abused: No  ? Physically Abused: No  ? Sexually Abused: No  ? ? ?Current Outpatient Medications on File Prior to Visit  ?Medication Sig Dispense Refill  ? b complex vitamins tablet Take 1 tablet by mouth daily.    ? methimazole (TAPAZOLE) 5 MG tablet Take 0.5 tablets (2.5 mg total) by mouth daily. 45 tablet 3  ? PFIZER COVID-19 VAC BIVALENT injection     ? ?No current facility-administered medications on file prior to visit.  ? ? ?No Known Allergies ? ?Family History  ?Problem Relation Age of Onset  ? Hypertension Mother   ? Arthritis Mother   ?  Hyperlipidemia Mother   ? Prostate cancer Father   ? Cancer Father   ? Hyperlipidemia Father   ? Hypertension Father   ? Prostate cancer Brother   ? Obesity Brother   ? Hyperthyroidism Brother   ? Asthma Maternal Grandmother   ? Hyperlipidemia Maternal Grandfather   ? Hypertension Maternal Grandfather   ? Stroke Maternal Grandfather   ? COPD Paternal Grandfather   ? Colon cancer Neg Hx   ? Esophageal cancer Neg Hx   ? Rectal cancer Neg Hx   ? Stomach cancer Neg Hx   ? ? ?BP 114/64   Pulse 68   Ht 6\' 3"  (1.905 m)   Wt 207 lb 12.8 oz (94.3 kg)   SpO2 98%   BMI 25.97 kg/m?  ? ? ?Review of Systems ? ?   ?Objective:  ? Physical Exam ?VITAL SIGNS:  See vs page ?GENERAL: no distress ?NECK: Thyroid is slightly and diffusely enlarged.  No thyroid nodule is palpable.  No palpable lymphadenopathy at the anterior neck.    ? ? ?Lab Results  ?Component Value Date  ? TSH 2.82 06/14/2021  ? T4TOTAL 7.7 09/29/2020  ? ?   ?Assessment & Plan:  ?Hyperthyroidism: well-controlled.  Please continue the same methimazole ? ?

## 2021-07-28 DIAGNOSIS — N402 Nodular prostate without lower urinary tract symptoms: Secondary | ICD-10-CM | POA: Diagnosis not present

## 2021-07-28 LAB — PSA: PSA: 1.41

## 2021-08-01 ENCOUNTER — Other Ambulatory Visit: Payer: Self-pay | Admitting: Endocrinology

## 2021-08-04 DIAGNOSIS — N5201 Erectile dysfunction due to arterial insufficiency: Secondary | ICD-10-CM | POA: Diagnosis not present

## 2021-08-04 DIAGNOSIS — N402 Nodular prostate without lower urinary tract symptoms: Secondary | ICD-10-CM | POA: Diagnosis not present

## 2021-08-08 ENCOUNTER — Encounter: Payer: Self-pay | Admitting: Family Medicine

## 2021-08-29 DIAGNOSIS — L299 Pruritus, unspecified: Secondary | ICD-10-CM | POA: Diagnosis not present

## 2021-09-04 ENCOUNTER — Ambulatory Visit: Payer: No Typology Code available for payment source | Admitting: Endocrinology

## 2021-10-20 ENCOUNTER — Telehealth: Payer: Self-pay

## 2021-10-20 NOTE — Telephone Encounter (Signed)
LMTCB for patient to schedule with new provider

## 2021-11-29 ENCOUNTER — Ambulatory Visit (INDEPENDENT_AMBULATORY_CARE_PROVIDER_SITE_OTHER): Payer: No Typology Code available for payment source | Admitting: Family Medicine

## 2021-11-29 ENCOUNTER — Encounter: Payer: Self-pay | Admitting: Family Medicine

## 2021-11-29 VITALS — BP 110/78 | HR 62 | Temp 98.0°F | Ht 75.0 in | Wt 199.2 lb

## 2021-11-29 DIAGNOSIS — Z13220 Encounter for screening for lipoid disorders: Secondary | ICD-10-CM

## 2021-11-29 DIAGNOSIS — E0590 Thyrotoxicosis, unspecified without thyrotoxic crisis or storm: Secondary | ICD-10-CM

## 2021-11-29 DIAGNOSIS — F320 Major depressive disorder, single episode, mild: Secondary | ICD-10-CM

## 2021-11-29 DIAGNOSIS — Z131 Encounter for screening for diabetes mellitus: Secondary | ICD-10-CM | POA: Diagnosis not present

## 2021-11-29 DIAGNOSIS — H6090 Unspecified otitis externa, unspecified ear: Secondary | ICD-10-CM | POA: Diagnosis not present

## 2021-11-29 DIAGNOSIS — Z0001 Encounter for general adult medical examination with abnormal findings: Secondary | ICD-10-CM | POA: Diagnosis not present

## 2021-11-29 LAB — CBC
HCT: 44.6 % (ref 39.0–52.0)
Hemoglobin: 15.1 g/dL (ref 13.0–17.0)
MCHC: 33.8 g/dL (ref 30.0–36.0)
MCV: 92.4 fl (ref 78.0–100.0)
Platelets: 135 10*3/uL — ABNORMAL LOW (ref 150.0–400.0)
RBC: 4.83 Mil/uL (ref 4.22–5.81)
RDW: 13.9 % (ref 11.5–15.5)
WBC: 4.7 10*3/uL (ref 4.0–10.5)

## 2021-11-29 LAB — COMPREHENSIVE METABOLIC PANEL
ALT: 9 U/L (ref 0–53)
AST: 20 U/L (ref 0–37)
Albumin: 4.2 g/dL (ref 3.5–5.2)
Alkaline Phosphatase: 54 U/L (ref 39–117)
BUN: 28 mg/dL — ABNORMAL HIGH (ref 6–23)
CO2: 28 mEq/L (ref 19–32)
Calcium: 9.3 mg/dL (ref 8.4–10.5)
Chloride: 105 mEq/L (ref 96–112)
Creatinine, Ser: 1.31 mg/dL (ref 0.40–1.50)
GFR: 55.75 mL/min — ABNORMAL LOW (ref >60.00)
Glucose, Bld: 95 mg/dL (ref 70–99)
Potassium: 4.9 mEq/L (ref 3.5–5.1)
Sodium: 139 mEq/L (ref 135–145)
Total Bilirubin: 0.9 mg/dL (ref 0.2–1.2)
Total Protein: 6.8 g/dL (ref 6.0–8.3)

## 2021-11-29 LAB — HEMOGLOBIN A1C: Hgb A1c MFr Bld: 5.5 % (ref 4.6–6.5)

## 2021-11-29 LAB — LIPID PANEL
Cholesterol: 179 mg/dL (ref 0–200)
HDL: 69.6 mg/dL (ref >39.00)
LDL Cholesterol: 97 mg/dL (ref 0–99)
NonHDL: 109.57
Total CHOL/HDL Ratio: 3
Triglycerides: 62 mg/dL (ref 0.0–149.0)
VLDL: 12.4 mg/dL (ref 0.0–40.0)

## 2021-11-29 LAB — TSH: TSH: 0.88 u[IU]/mL (ref 0.35–5.50)

## 2021-11-29 NOTE — Patient Instructions (Signed)
It was very nice to see you today!  Try cortisone cream for your ear.  We will check labs.  Come back in 1 year for your next physical or sooner if needed.   Take care, Dr [name]  PLEASE NOTE:  If you had any lab tests please let us know if you have not heard back within a few days. You may see your results on mychart before we have a chance to review them but we will give you a call once they are reviewed by [place]. If we ordered any referrals today, please let us know if you have not heard from their office within the next week.   Please try these tips to maintain a healthy lifestyle:  Eat at least 3 REAL meals and 1-2 snacks per day.  Aim for no more than 5 hours between eating.  If you eat breakfast, please do so within one hour of getting up.   Each meal should contain half fruits/vegetables, one quarter protein, and one quarter carbs (no bigger than a computer mouse)  Cut down on sweet beverages. This includes juice, soda, and sweet tea.   Drink at least 1 glass of water with each meal and aim for at least 8 glasses per day  Exercise at least 150 minutes every week.    Preventive Care 8 Years and Older, Male Preventive care refers to lifestyle choices and visits with your health care provider that can promote health and wellness. Preventive care visits are also called wellness exams. What can I expect for my preventive care visit? Counseling During your preventive care visit, your health care provider may ask about your: Medical history, including: Past medical problems. Family medical history. History of falls. Current health, including: Emotional well-being. Home life and relationship well-being. Sexual activity. Memory and ability to understand (cognition). Lifestyle, including: Alcohol, nicotine or tobacco, and drug use. Access to firearms. Diet, exercise, and sleep habits. Work and work Astronomer. Sunscreen use. Safety issues such as seatbelt and bike helmet  use. Physical exam Your health care provider will check your: Height and weight. These may be used to calculate your BMI (body mass index). BMI is a measurement that tells if you are at a healthy weight. Waist circumference. This measures the distance around your waistline. This measurement also tells if you are at a healthy weight and may help predict your risk of certain diseases, such as type 2 diabetes and high blood pressure. Heart rate and blood pressure. Body temperature. Skin for abnormal spots. What immunizations do I need?  Vaccines are usually given at various ages, according to a schedule. Your health care provider will recommend vaccines for you based on your age, medical history, and lifestyle or other factors, such as travel or where you work. What tests do I need? Screening Your health care provider may recommend screening tests for certain conditions. This may include: Lipid and cholesterol levels. Diabetes screening. This is done by checking your blood sugar (glucose) after you have not eaten for a while (fasting). Hepatitis C test. Hepatitis B test. HIV (human immunodeficiency virus) test. STI (sexually transmitted infection) testing, if you are at risk. Lung cancer screening. Colorectal cancer screening. Prostate cancer screening. Abdominal aortic aneurysm (AAA) screening. You may need this if you are a current or former smoker. Talk with your health care provider about your test results, treatment options, and if necessary, the need for more tests. Follow these instructions at home: Eating and drinking  Eat a diet that  includes fresh fruits and vegetables, whole grains, lean protein, and low-fat dairy products. Limit your intake of foods with high amounts of sugar, saturated fats, and salt. Take vitamin and mineral supplements as recommended by your health care provider. Do not drink alcohol if your health care provider tells you not to drink. If you drink  alcohol: Limit how much you have to 0-2 drinks a day. Know how much alcohol is in your drink. In the [place] one drink equals one 12 oz bottle of beer (355 mL), one 5 oz glass of wine (148 mL), or one 1 oz glass of hard liquor (44 mL). Lifestyle Brush your teeth every morning and night with fluoride toothpaste. Floss one time each day. Exercise for at least 30 minutes 5 or more days each week. Do not use any products that contain nicotine or tobacco. These products include cigarettes, chewing tobacco, and vaping devices, such as e-cigarettes. If you need help quitting, ask your health care provider. Do not use drugs. If you are sexually active, practice safe sex. Use a condom or other form of protection to prevent STIs. Take aspirin only as told by your health care provider. Make sure that you understand how much to take and what form to take. Work with your health care provider to find out whether it is safe and beneficial for you to take aspirin daily. Ask your health care provider if you need to take a cholesterol-lowering medicine (statin). Find healthy ways to manage stress, such as: Meditation, yoga, or listening to music. Journaling. Talking to a trusted person. Spending time with friends and family. Safety Always wear your seat belt while driving or riding in a vehicle. Do not drive: If you have been drinking alcohol. Do not ride with someone who has been drinking. When you are tired or distracted. While texting. If you have been using any mind-altering substances or drugs. Wear a helmet and other protective equipment during sports activities. If you have firearms in your house, make sure you follow all gun safety procedures. Minimize exposure to UV radiation to reduce your risk of skin cancer. What's next? Visit your health care provider once a year for an annual wellness visit. Ask your health care provider how often you should have your eyes and teeth checked. Stay up to date  on all vaccines. This information is not intended to replace advice given to you by your health care provider. Make sure you discuss any questions you have with your health care provider. Document Revised: 09/28/2020 Document Reviewed: 09/28/2020 Elsevier Patient Education  2023 [employer].

## 2021-11-29 NOTE — Assessment & Plan Note (Signed)
Follows with endocrinology. On methimazole.

## 2021-11-29 NOTE — Assessment & Plan Note (Signed)
Doing much better now. Not currently on any medications.

## 2021-11-29 NOTE — Progress Notes (Signed)
Chief Complaint:  Wesley Black is a 71 y.o. male who presents today for his annual comprehensive physical exam.    Assessment/Plan:  New/Acute Problems: Otitis Externa No red flags. Possibly has underlying seb derm.  He can use the mometasone drops prescribed by ENT.  Can also try small amount of topical hydrocortisone cream to the external ear.  He will let us know if not improving.  Chronic Problems Addressed Today: Depression, major, single episode, mild ([place]) Doing much better now. Not currently on any medications.   Hyperthyroidism Follows with endocrinology. On methimazole.    Preventative Healthcare: Check labs.  Discussion with vaccine however he deferred for today.  Up-to-date on other vaccines.  Patient Counseling(The following topics were reviewed and/or handout was given):  -Nutrition: Stressed importance of moderation in sodium/caffeine intake, saturated fat and cholesterol, caloric balance, sufficient intake of fresh fruits, vegetables, and fiber.  -Stressed the importance of regular exercise.   -Substance Abuse: Discussed cessation/primary prevention of tobacco, alcohol, or other drug use; driving or other dangerous activities under the influence; availability of treatment for abuse.   -Injury prevention: Discussed safety belts, safety helmets, smoke detector, smoking near bedding or upholstery.   -Sexuality: Discussed sexually transmitted diseases, partner selection, use of condoms, avoidance of unintended pregnancy and contraceptive alternatives.   -Dental health: Discussed importance of regular tooth brushing, flossing, and dental visits.  -Health maintenance and immunizations reviewed. Please refer to Health maintenance section.  Return to care in 1 year for next preventative visit.     Subjective:  HPI:  He has no acute complaints today.   He went to ENT a few months ago with crusting in his ears. He was diagnosed with otitis externa. He underwent  debridment but symptoms have persisted. He still has some oily discharge. He was given a prescription for ear drops but he has not used them  Lifestyle Diet: Balanced.  Exercise: Rides bicycle routinely.      11/29/2021    8:16 AM  Depression screen PHQ 2/9  Decreased Interest 0  Down, Depressed, Hopeless 0  PHQ - 2 Score 0   There are no preventive care reminders to display for this patient.   ROS: Per HPI, otherwise a complete review of systems was negative.   PMH:  The following were reviewed and entered/updated in epic: Past Medical History:  Diagnosis Date   DVT (deep venous thrombosis) (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   Past Surgical History:  Procedure Laterality Date   COLONOSCOPY     ENDOVENOUS ABLATION SAPHENOUS VEIN W/ LASER Right 07/28/2020   endovenous laser ablation right greater saphenous vein and stab phlebectomy < 10 incisions right leg by Gae Gallop MD    KNEE ARTHROPLASTY Right 2004   LASIK Bilateral 2001   SHOULDER SURGERY Left 1984    Family History  Problem Relation Age of Onset   Hypertension Mother    Arthritis Mother    Hyperlipidemia Mother    Prostate cancer Father    Cancer Father    Hyperlipidemia Father    Hypertension Father    Prostate cancer Brother    Obesity Brother    Hyperthyroidism Brother  Asthma Maternal Grandmother    Hyperlipidemia Maternal Grandfather    Hypertension Maternal Grandfather    Stroke Maternal Grandfather    COPD Paternal Grandfather    Colon cancer Neg Hx    Esophageal cancer Neg Hx    Rectal cancer Neg Hx    Stomach cancer Neg Hx     Medications- reviewed and updated Current Outpatient Medications   Medication Sig Dispense Refill   b complex vitamins tablet Take 1 tablet by mouth daily.     methimazole (TAPAZOLE) 5 MG tablet TAKE 1/2 TABLET BY MOUTH DAILY 45 tablet 3   mometasone (ELOCON) 0.1 % lotion Apply 2 drops into the affected ear twice daily for up to 3 days, repeat as necessary.     PFIZER COVID-19 VAC BIVALENT injection      No current facility-administered medications for this visit.    Allergies-reviewed and updated No Known Allergies  Social History   Socioeconomic History   Marital status: Single    Spouse name: Not on file   Number of children: 0   Years of education: Master's   Highest education level: Not on file  Occupational History   Occupation: Retired  Tobacco Use   Smoking status: Never   Smokeless tobacco: Never  Vaping Use   Vaping Use: Never used  Substance and Sexual Activity   Alcohol use: Yes    Comment: 2 glasses of wine or beer per month   Drug use: No   Sexual activity: Not on file    Comment: Married  Other Topics Concern   Not on file  Social History Narrative   Lives at home, separated   Left-handed   Caffeine: 1 glass of tea per day, occasional 7 oz Pepsi (every 2 wks)      Social Determinants of Health   Financial Resource Strain: Low Risk  (04/27/2021)   Overall Financial Resource Strain (CARDIA)    Difficulty of Paying Living Expenses: Not hard at all  Food Insecurity: No Food Insecurity (04/27/2021)   Hunger Vital Sign    Worried About Running Out of Food in the Last Year: Never true    Ran Out of Food in the Last Year: Never true  Transportation Needs: No Transportation Needs (04/27/2021)   PRAPARE - Administrator, Civil Service (Medical): No    Lack of Transportation (Non-Medical): No  Physical Activity: Sufficiently Active (04/27/2021)   Exercise Vital Sign    Days of Exercise per Week: 3 days    Minutes of Exercise per Session: 90 min  Stress: No Stress Concern Present (04/27/2021)   [employer]  of Occupational Health - Occupational Stress Questionnaire    Feeling of Stress : Not at all  Social Connections: Moderately Integrated (04/27/2021)   Social Connection and Isolation Panel [NHANES]    Frequency of Communication with Friends and Family: Once a week    Frequency of Social Gatherings with Friends and Family: More than three times a week    Attends Religious Services: More than 4 times per year    Active Member of [employer] or Organizations: Yes    Attends Banker Meetings: 1 to 4 times per year    Marital Status: Never married        Objective:  Physical Exam: BP 110/78   Pulse 62   Temp 98 F (36.7 C)   Ht 6\' 3"  (1.905 m)   Wt 199 lb 3.2 oz (90.4 kg)   SpO2 96%  BMI 24.90 kg/m   Body mass index is 24.9 kg/m. Wt Readings from Last 3 Encounters:  11/29/21 199 lb 3.2 oz (90.4 kg)  06/14/21 207 lb 12.8 oz (94.3 kg)  04/27/21 202 lb (91.6 kg)   Gen: NAD, resting comfortably HEENT: TMs normal bilaterally. Bilateral EAC with erythema and crusting.  CV: RRR with no murmurs appreciated Pulm: NWOB, CTAB with no crackles, wheezes, or rhonchi GI: Normal bowel sounds present. Soft, Nontender, Nondistended. MSK: no edema, cyanosis, or clubbing noted Skin: warm, dry Neuro: CN2-12 grossly intact. Strength 5/5 in upper and lower extremities. Reflexes symmetric and intact bilaterally.  Psych: Normal affect and thought content     Seana Underwood M. [name], MD 11/29/2021 9:04 AM

## 2021-12-01 NOTE — Progress Notes (Signed)
Please inform patient of the following:  Blood work shows that he is a little bit dehydrated everything else is stable.  Do not need to make any changes to the treatment plan at this time.  He should make sure that he is getting plenty of fluids.  We can recheck everything in a year.

## 2021-12-14 ENCOUNTER — Other Ambulatory Visit: Payer: Self-pay | Admitting: Endocrinology

## 2021-12-14 DIAGNOSIS — D696 Thrombocytopenia, unspecified: Secondary | ICD-10-CM | POA: Diagnosis not present

## 2021-12-14 DIAGNOSIS — E0590 Thyrotoxicosis, unspecified without thyrotoxic crisis or storm: Secondary | ICD-10-CM

## 2021-12-14 DIAGNOSIS — N1831 Chronic kidney disease, stage 3a: Secondary | ICD-10-CM | POA: Diagnosis not present

## 2021-12-14 DIAGNOSIS — Z86718 Personal history of other venous thrombosis and embolism: Secondary | ICD-10-CM | POA: Diagnosis not present

## 2021-12-14 DIAGNOSIS — N401 Enlarged prostate with lower urinary tract symptoms: Secondary | ICD-10-CM | POA: Diagnosis not present

## 2021-12-15 ENCOUNTER — Ambulatory Visit
Admission: RE | Admit: 2021-12-15 | Discharge: 2021-12-15 | Disposition: A | Discharge status: Home / Self Care | Payer: No Typology Code available for payment source | Source: Ambulatory Visit | Attending: Endocrinology | Admitting: Endocrinology

## 2021-12-15 DIAGNOSIS — E0590 Thyrotoxicosis, unspecified without thyrotoxic crisis or storm: Secondary | ICD-10-CM

## 2021-12-15 DIAGNOSIS — E042 Nontoxic multinodular goiter: Secondary | ICD-10-CM | POA: Diagnosis not present

## 2022-03-29 ENCOUNTER — Ambulatory Visit (INDEPENDENT_AMBULATORY_CARE_PROVIDER_SITE_OTHER): Payer: No Typology Code available for payment source | Admitting: Family Medicine

## 2022-03-29 ENCOUNTER — Encounter: Payer: Self-pay | Admitting: Family Medicine

## 2022-03-29 VITALS — BP 124/78 | HR 53 | Temp 97.7°F | Ht 75.0 in | Wt 204.4 lb

## 2022-03-29 DIAGNOSIS — L821 Other seborrheic keratosis: Secondary | ICD-10-CM

## 2022-03-29 DIAGNOSIS — Z131 Encounter for screening for diabetes mellitus: Secondary | ICD-10-CM | POA: Diagnosis not present

## 2022-03-29 DIAGNOSIS — Z13220 Encounter for screening for lipoid disorders: Secondary | ICD-10-CM | POA: Diagnosis not present

## 2022-03-29 DIAGNOSIS — E0590 Thyrotoxicosis, unspecified without thyrotoxic crisis or storm: Secondary | ICD-10-CM

## 2022-03-29 DIAGNOSIS — Z23 Encounter for immunization: Secondary | ICD-10-CM | POA: Diagnosis not present

## 2022-03-29 DIAGNOSIS — Z125 Encounter for screening for malignant neoplasm of prostate: Secondary | ICD-10-CM

## 2022-03-29 LAB — CBC
HCT: 42.3 % (ref 39.0–52.0)
Hemoglobin: 14.7 g/dL (ref 13.0–17.0)
MCHC: 34.7 g/dL (ref 30.0–36.0)
MCV: 92 fl (ref 78.0–100.0)
Platelets: 143 10*3/uL — ABNORMAL LOW (ref 150.0–400.0)
RBC: 4.6 Mil/uL (ref 4.22–5.81)
RDW: 13.5 % (ref 11.5–15.5)
WBC: 4.1 10*3/uL (ref 4.0–10.5)

## 2022-03-29 LAB — LIPID PANEL
Cholesterol: 168 mg/dL (ref 0–200)
HDL: 62 mg/dL (ref >39.00)
LDL Cholesterol: 92 mg/dL (ref 0–99)
NonHDL: 105.51
Total CHOL/HDL Ratio: 3
Triglycerides: 67 mg/dL (ref 0.0–149.0)
VLDL: 13.4 mg/dL (ref 0.0–40.0)

## 2022-03-29 LAB — COMPREHENSIVE METABOLIC PANEL
ALT: 10 U/L (ref 0–53)
AST: 19 U/L (ref 0–37)
Albumin: 4.1 g/dL (ref 3.5–5.2)
Alkaline Phosphatase: 59 U/L (ref 39–117)
BUN: 20 mg/dL (ref 6–23)
CO2: 32 mEq/L (ref 19–32)
Calcium: 9.2 mg/dL (ref 8.4–10.5)
Chloride: 105 mEq/L (ref 96–112)
Creatinine, Ser: 1.23 mg/dL (ref 0.40–1.50)
GFR: 59.99 mL/min — ABNORMAL LOW (ref >60.00)
Glucose, Bld: 91 mg/dL (ref 70–99)
Potassium: 5.3 mEq/L — ABNORMAL HIGH (ref 3.5–5.1)
Sodium: 142 mEq/L (ref 135–145)
Total Bilirubin: 0.7 mg/dL (ref 0.2–1.2)
Total Protein: 6.3 g/dL (ref 6.0–8.3)

## 2022-03-29 LAB — HEMOGLOBIN A1C: Hgb A1c MFr Bld: 5.3 % (ref 4.6–6.5)

## 2022-03-29 LAB — T4, FREE: Free T4: 0.95 ng/dL (ref 0.60–1.60)

## 2022-03-29 LAB — PSA: PSA: 1.93 ng/mL (ref 0.10–4.00)

## 2022-03-29 LAB — T3, FREE: T3, Free: 3.3 pg/mL (ref 2.3–4.2)

## 2022-03-29 LAB — TSH: TSH: 1.54 u[IU]/mL (ref 0.35–5.50)

## 2022-03-29 NOTE — Progress Notes (Signed)
   Wesley Black is a 71 y.o. male who presents today for an office visit.  Assessment/Plan:  New/Acute Problems: Skin Lesions Lesion on right lateral epicondyle consistent with sebaceous cyst.  May be underlying lipoma as well.  No red flag signs or symptoms.  It is reassuring that symptoms been present for several months and do not change.  Will continue to fluctuating.  We did discuss referral for surgical excision however he deferred for now.  We discussed reasons to return to care.  He will let me know if any symptoms change.  Chronic Problems Addressed Today: Seborrheic keratoses Reassured patient that lesion on left shoulder is consistent with seborrheic keratoses.  No red flag signs or symptoms.  Will continue with watchful waiting.  We discussed reasons return to care.  Hyperthyroidism Follows with endocrinology.  He would like to have labs done today.  He is currently on methimazole 2.5 mg daily.  Check TSH, T4, and T3.  Preventative health care Discussed vaccines.  We get a flu shot today.  He will go to the pharmacy for RSV.  He is up-to-date on his other vaccines.  Will check other labs today including PSA, lipid panel, and A1c per patient request.    Subjective:  HPI:  See A/p for status of chronic conditions.  He has a couple of skin lesions he would like to be evaluated for today.  He has noticed a nodule in the lateral aspect of his right elbow.  This has been there for the last several months.  Freely mobile.  No obvious injuries or precipitating events.  Nonpainful.  Symptoms have not changed over the last several months.  No specific treatments tried.  Is also noticed some lesions on his left shoulder.  Is also been there for months to years.  No recent changes.  Symptoms appear more prominent.       Objective:  Physical Exam: BP 124/78 (BP Location: Left Arm, Patient Position: Sitting)   Pulse (!) 53   Temp 97.7 F (36.5 C) (Temporal)   Ht 6\' 3"  (1.905 m)    Wt 204 lb 6.4 oz (92.7 kg)   SpO2 99%   BMI 25.55 kg/m   Gen: No acute distress, resting comfortably Skin: Freely mobile 1 cm nodule on right lateral epicondyle.  Nontender to palpation.  Scattered seborrheic keratoses noted on left shoulder. Neuro: Grossly normal, moves all extremities Psych: Normal affect and thought content      Wesley Jocson M. , MD 03/29/2022 11:01 AM

## 2022-03-29 NOTE — Patient Instructions (Addendum)
It was very nice to see you today!  You have a small cyst on your elbow.  This is benign.  This should not cause any issues.  Please let us know if your symptoms change in any way.  The spots on your left shoulder are benign age spots called seborrheic keratoses.  These are also not a cause for concern.  Please let us know if your symptoms change.  Will give your flu shot today.  You need to RSV vaccine through pharmacy.  We will check blood work today.  I will see you back next August when you are due for your annual physical.  Please come back to see [place] sooner if needed.  Take care, Dr [name]  PLEASE NOTE:  If you had any lab tests please let us know if you have not heard back within a few days. You may see your results on mychart before we have a chance to review them but we will give you a call once they are reviewed by [place]. If we ordered any referrals today, please let us know if you have not heard from their office within the next week.   Please try these tips to maintain a healthy lifestyle:  Eat at least 3 REAL meals and 1-2 snacks per day.  Aim for no more than 5 hours between eating.  If you eat breakfast, please do so within one hour of getting up.   Each meal should contain half fruits/vegetables, one quarter protein, and one quarter carbs (no bigger than a computer mouse)  Cut down on sweet beverages. This includes juice, soda, and sweet tea.   Drink at least 1 glass of water with each meal and aim for at least 8 glasses per day  Exercise at least 150 minutes every week.

## 2022-03-29 NOTE — Assessment & Plan Note (Signed)
Reassured patient that lesion on left shoulder is consistent with seborrheic keratoses.  No red flag signs or symptoms.  Will continue with watchful waiting.  We discussed reasons return to care.

## 2022-03-29 NOTE — Assessment & Plan Note (Signed)
Follows with endocrinology.  He would like to have labs done today.  He is currently on methimazole 2.5 mg daily.  Check TSH, T4, and T3.

## 2022-03-30 NOTE — Progress Notes (Signed)
Please inform patient of the following:  Labs are all stable.  Do not need to make any changes to his treatment plan at this time.  He should continue to work on diet and exercise and we can recheck in a year.

## 2022-05-02 ENCOUNTER — Telehealth: Payer: Medicare HMO | Admitting: Physician Assistant

## 2022-05-02 DIAGNOSIS — U071 COVID-19: Secondary | ICD-10-CM

## 2022-05-02 MED ORDER — NIRMATRELVIR/RITONAVIR (PAXLOVID) TABLET (RENAL DOSING): 2.0000 | ORAL_TABLET | Freq: Two times a day (BID) | ORAL | 0 refills | Status: DC

## 2022-05-02 MED ORDER — NIRMATRELVIR/RITONAVIR (PAXLOVID) TABLET (RENAL DOSING): 2.0000 | ORAL_TABLET | Freq: Two times a day (BID) | ORAL | 0 refills | Status: AC

## 2022-05-02 NOTE — Patient Instructions (Signed)
Wesley Black, thank you for joining Margaretann Loveless, PA-C for today's virtual visit.  While this provider is not your primary care provider (PCP), if your PCP is located in our provider database this encounter information will be shared with them immediately following your visit.   A [facility] MyChart account gives you access to today's visit and all your visits, tests, and labs performed at [facility] " click here if you don't have a [facility] MyChart account or go to [email or web address]  Consent: (Patient) Wesley Black provided verbal consent for this virtual visit at the beginning of the encounter.  Current Medications:  Current Outpatient Medications:    nirmatrelvir/ritonavir, renal dosing, (PAXLOVID) 10 x 150 MG & 10 x 100MG  TABS, Take 2 tablets by mouth 2 (two) times daily for 5 days. (Take nirmatrelvir 150 mg one tablet twice daily for 5 days and ritonavir 100 mg one tablet twice daily for 5 days) Patient GFR is 59, Disp: 20 tablet, Rfl: 0   b complex vitamins tablet, Take 1 tablet by mouth daily., Disp: , Rfl:    methimazole (TAPAZOLE) 5 MG tablet, TAKE 1/2 TABLET BY MOUTH DAILY, Disp: 45 tablet, Rfl: 3   PFIZER COVID-19 VAC BIVALENT injection, , Disp: , Rfl:    Medications ordered in this encounter:  Meds ordered this encounter  Medications   nirmatrelvir/ritonavir, renal dosing, (PAXLOVID) 10 x 150 MG & 10 x 100MG  TABS    Sig: Take 2 tablets by mouth 2 (two) times daily for 5 days. (Take nirmatrelvir 150 mg one tablet twice daily for 5 days and ritonavir 100 mg one tablet twice daily for 5 days) Patient GFR is 59    Dispense:  20 tablet    Refill:  0    Order Specific Question:   Supervising Provider    Answer:        *If you need refills on other medications prior to your next appointment, please contact your pharmacy*  Follow-Up: Call back or seek an in-person evaluation if the symptoms worsen or if the condition  fails to improve as anticipated.  [facility] [phone]  Other Instructions  Covid 19 Isolation Guidelines: Isolate for 5 days from symptom onset, with first day of symptoms counting as Day 0; If fever-free and symptoms are improving can come out of isolation on Day 6, but it is RECOMMENDED to wear a mask from Day 6 - Day 10 anytime you are in close proximity with others or in crowded places. If still having significant symptoms, or fevers are present on Day 5 of symptoms, it is RECOMMENDED to ISOLATE for a full 10 day isolation period.   COVID-19 COVID-19, or coronavirus disease 2019, is an infection that is caused by a new (novel) coronavirus called SARS-CoV-2. COVID-19 can cause many symptoms. In some people, the virus may not cause any symptoms. In others, it may cause mild or severe symptoms. Some people with severe infection develop severe disease. What are the causes? This illness is caused by a virus. The virus may be in the air as tiny specks of fluid (aerosols) or droplets, or it may be on surfaces. You may catch the virus by: Breathing in droplets from an infected person. Droplets can be spread by a person breathing, speaking, singing, coughing, or sneezing. Touching something, like a table or a doorknob, that has virus on it (is contaminated) and then touching your mouth, nose, or eyes. What increases the risk?  Risk for infection: You are more likely to get infected with the COVID-19 virus if: You are within 6 ft (1.8 m) of a person with COVID-19 for 15 minutes or longer. You are providing care for a person who is infected with COVID-19. You are in close personal contact with other people. Close personal contact includes hugging, kissing, or sharing eating or drinking utensils. Risk for serious illness caused by COVID-19: You are more likely to get seriously ill from the COVID-19 virus if: You have cancer. You have a long-term (chronic) disease, such as: Chronic  lung disease. This includes pulmonary embolism, chronic obstructive pulmonary disease, and cystic fibrosis. Long-term disease that lowers your body's ability to fight infection (immunocompromise). Serious cardiac conditions, such as heart failure, coronary artery disease, or cardiomyopathy. Diabetes. Chronic kidney disease. Liver diseases. These include cirrhosis, nonalcoholic fatty liver disease, alcoholic liver disease, or autoimmune hepatitis. You have obesity. You are pregnant or were recently pregnant. You have sickle cell disease. What are the signs or symptoms? Symptoms of this condition can range from mild to severe. Symptoms may appear any time from 2 to 14 days after being exposed to the virus. They include: Fever or chills. Shortness of breath or trouble breathing. Feeling tired or very tired. Headaches, body aches, or muscle aches. Runny or stuffy nose, sneezing, coughing, or sore throat. New loss of taste or smell. This is rare. Some people may also have stomach problems, such as nausea, vomiting, or diarrhea. Other people may not have any symptoms of COVID-19. How is this diagnosed? This condition may be diagnosed by testing samples to check for the COVID-19 virus. The most common tests are the PCR test and the antigen test. Tests may be done in the lab or at home. They include: Using a swab to take a sample of fluid from the back of your nose and throat (nasopharyngeal fluid), from your nose, or from your throat. Testing a sample of saliva from your mouth. Testing a sample of coughed-up mucus from your lungs (sputum). How is this treated? Treatment for COVID-19 infection depends on the severity of the condition. Mild symptoms can be managed at home with rest, fluids, and over-the-counter medicines. Serious symptoms may be treated in a hospital intensive care unit (ICU). Treatment in the ICU may include: Supplemental oxygen. Extra oxygen is given through a tube in the nose,  a face mask, or a hood. Medicines. These may include: Antivirals, such as monoclonal antibodies. These help your body fight off certain viruses that can cause disease. Anti-inflammatories, such as corticosteroids. These reduce inflammation and suppress the immune system. Antithrombotics. These prevent or treat blood clots, if they develop. Convalescent plasma. This helps boost your immune system, if you have an underlying immunosuppressive condition or are getting immunosuppressive treatments. Prone positioning. This means you will lie on your stomach. This helps oxygen to get into your lungs. Infection control measures. If you are at risk for more serious illness caused by COVID-19, your health care provider may prescribe two long-acting monoclonal antibodies, given together every 6 months. How is this prevented? To protect yourself: Use preventive medicine (pre-exposure prophylaxis). You may get pre-exposure prophylaxis if you have moderate or severe immunocompromise. Get vaccinated. Anyone 79 months old or older who meets guidelines can get a COVID-19 vaccine or vaccine series. This includes people who are pregnant or making breast milk (lactating). Get an added dose of COVID-19 vaccine after your first vaccine or vaccine series if you have moderate to severe immunocompromise. This  applies if you have had a solid organ transplant or have been diagnosed with an immunocompromising condition. You should get the added dose 4 weeks after you got the first COVID-19 vaccine or vaccine series. If you get an mRNA vaccine, you will need a 3-dose primary series. If you get the J&J/Janssen vaccine, you will need a 2-dose primary series, with the second dose being an mRNA vaccine. Talk to your health care provider about getting experimental monoclonal antibodies. This treatment is approved under emergency use authorization to prevent severe illness before or after being exposed to the COVID-19 virus. You may  be given monoclonal antibodies if: You have moderate or severe immunocompromise. This includes treatments that lower your immune response. People with immunocompromise may not develop protection against COVID-19 when they are vaccinated. You cannot be vaccinated. You may not get a vaccine if you have a severe allergic reaction to the vaccine or its components. You are not fully vaccinated. You are in a facility where COVID-19 is present and: Are in close contact with a person who is infected with the COVID-19 virus. Are at high risk of being exposed to the COVID-19 virus. You are at risk of illness from new variants of the COVID-19 virus. To protect others: If you have symptoms of COVID-19, take steps to prevent the virus from spreading to others. Stay home. Leave your house only to get medical care. Do not use public transit, if possible. Do not travel while you are sick. Wash your hands often with soap and water for at least 20 seconds. If soap and water are not available, use alcohol-based hand sanitizer. Make sure that all people in your household wash their hands well and often. Cough or sneeze into a tissue or your sleeve or elbow. Do not cough or sneeze into your hand or into the air. Where to find more information Centers for Disease Control and Prevention: CharmCourses.be World Health Organization: https://www.castaneda.info/ Get help right away if: You have trouble breathing. You have pain or pressure in your chest. You are confused. You have bluish lips and fingernails. You have trouble waking from sleep. You have symptoms that get worse. These symptoms may be an emergency. Get help right away. Call 911. Do not wait to see if the symptoms will go away. Do not drive yourself to the hospital. Summary COVID-19 is an infection that is caused by a new coronavirus. Sometimes, there are no symptoms. Other times, symptoms range from mild to severe. Some people with  a severe COVID-19 infection develop severe disease. The virus that causes COVID-19 can spread from person to person through droplets or aerosols from breathing, speaking, singing, coughing, or sneezing. Mild symptoms of COVID-19 can be managed at home with rest, fluids, and over-the-counter medicines. This information is not intended to replace advice given to you by your health care provider. Make sure you discuss any questions you have with your health care provider. Document Revised: 03/21/2021 Document Reviewed: 03/23/2021 Elsevier Patient Education  [place].    If you have been instructed to have an in-person evaluation today at a local Urgent Care facility, please use the link below. It will take you to a list of all of our available [facility], including address, phone number and hours of operation. Please do not delay care.  [facility]  If you or a family member do not have a primary care provider, use the link below to schedule a visit and establish care. When  you choose a [facility] primary care physician or advanced practice provider, you gain a long-term partner in health. Find a Primary Care Provider  Learn more about [facility]'s in-office and virtual care options: [facility]

## 2022-05-02 NOTE — Addendum Note (Signed)
Addended by: Mar Daring on: 05/02/2022 05:08 PM   Modules accepted: Orders

## 2022-05-02 NOTE — Progress Notes (Signed)
Virtual Visit Consent   Wesley Black, you are scheduled for a virtual visit with a [facility] provider today. Just as with appointments in the office, your consent must be obtained to participate. Your consent will be active for this visit and any virtual visit you may have with one of our providers in the next 365 days. If you have a MyChart account, a copy of this consent can be sent to you electronically.  As this is a virtual visit, video technology does not allow for your provider to perform a traditional examination. This may limit your provider's ability to fully assess your condition. If your provider identifies any concerns that need to be evaluated in person or the need to arrange testing (such as labs, EKG, etc.), we will make arrangements to do so. Although advances in technology are sophisticated, we cannot ensure that it will always work on either your end or our end. If the connection with a video visit is poor, the visit may have to be switched to a telephone visit. With either a video or telephone visit, we are not always able to ensure that we have a secure connection.  By engaging in this virtual visit, you consent to the provision of healthcare and authorize for your insurance to be billed (if applicable) for the services provided during this visit. Depending on your insurance coverage, you may receive a charge related to this service.  I need to obtain your verbal consent now. Are you willing to proceed with your visit today? Wesley Black has provided verbal consent on 05/02/2022 for a virtual visit (video or telephone). Mar Daring, PA-C  Date: 05/02/2022 4:27 PM  Virtual Visit via Video Note   I, Mar Daring, connected with  Wesley Black  ([id number], 01-31-1951) on 05/02/22 at  4:30 PM EST by a video-enabled telemedicine application and verified that I am speaking with the correct person using two identifiers.  Location: Patient: Virtual Visit Location  Patient: Home Provider: Virtual Visit Location Provider: Home Office   I discussed the limitations of evaluation and management by telemedicine and the availability of in person appointments. The patient expressed understanding and agreed to proceed.    History of Present Illness: Wesley Black is a 72 y.o. who identifies as a male who was assigned male at birth, and is being seen today for Covid 57.  HPI: URI  This is a new problem. Episode onset: Tested positive for Covid 19 this morning; Symptoms started last night. The problem has been gradually worsening. Associated symptoms include congestion, headaches, sinus pain and a sore throat. Pertinent negatives include no coughing, diarrhea, ear pain, nausea, plugged ear sensation, rhinorrhea or vomiting. Associated symptoms comments: Chills, myalgias. He has tried acetaminophen for the symptoms. The treatment provided no relief.     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 (Take nirmatrelvir 150 mg one tablet twice daily for 5 days and ritonavir 100 mg one tablet twice daily for  5 days) Patient GFR is 59, Disp: 20 tablet, Rfl: 0   b complex vitamins tablet, Take 1 tablet by mouth daily., Disp: , Rfl:    methimazole (TAPAZOLE) 5 MG tablet, TAKE 1/2 TABLET BY MOUTH DAILY, Disp: 45 tablet, Rfl: 3   PFIZER COVID-19 VAC BIVALENT injection, , Disp: , Rfl:   Observations/Objective: Patient is well-developed, well-nourished in no acute distress.   Resting comfortably at home.  Head is normocephalic, atraumatic.  No labored breathing.  Speech is clear and coherent with logical content.  Patient is alert and oriented at baseline.    Assessment and Plan: 1. COVID-19 - MyChart COVID-19 home monitoring program; Future - nirmatrelvir/ritonavir, renal dosing, (PAXLOVID) 10 x 150 MG & 10 x 100MG  TABS; Take 2 tablets by mouth 2 (two) times daily for 5 days. (Take nirmatrelvir 150 mg one tablet twice daily for 5 days and ritonavir 100 mg one tablet twice daily for 5 days) Patient GFR is 59  Dispense: 20 tablet; Refill: 0  - Continue OTC symptomatic management of choice - Will send OTC vitamins and supplement information through AVS - Paxlovid (renal dose) prescribed - Patient enrolled in MyChart symptom monitoring - Push fluids - Rest as needed - Discussed return precautions and when to seek in-person evaluation, sent via AVS as well   Follow Up Instructions: I discussed the assessment and treatment plan with the patient. The patient was provided an opportunity to ask questions and all were answered. The patient agreed with the plan and demonstrated an understanding of the instructions.  A copy of instructions were sent to the patient via MyChart unless otherwise noted below.    The patient was advised to call back or seek an in-person evaluation if the symptoms worsen or if the condition fails to improve as anticipated.  Time:  I spent 10 minutes with the patient via telehealth technology discussing the above problems/concerns.    Mar Daring, PA-C

## 2022-05-11 DIAGNOSIS — Z86718 Personal history of other venous thrombosis and embolism: Secondary | ICD-10-CM | POA: Diagnosis not present

## 2022-05-11 DIAGNOSIS — E0590 Thyrotoxicosis, unspecified without thyrotoxic crisis or storm: Secondary | ICD-10-CM | POA: Diagnosis not present

## 2022-05-11 DIAGNOSIS — N401 Enlarged prostate with lower urinary tract symptoms: Secondary | ICD-10-CM | POA: Diagnosis not present

## 2022-05-11 DIAGNOSIS — N1831 Chronic kidney disease, stage 3a: Secondary | ICD-10-CM | POA: Diagnosis not present

## 2022-05-11 DIAGNOSIS — D696 Thrombocytopenia, unspecified: Secondary | ICD-10-CM | POA: Diagnosis not present

## 2022-05-16 DIAGNOSIS — H40013 Open angle with borderline findings, low risk, bilateral: Secondary | ICD-10-CM | POA: Diagnosis not present

## 2022-07-25 DIAGNOSIS — R972 Elevated prostate specific antigen [PSA]: Secondary | ICD-10-CM | POA: Diagnosis not present

## 2022-07-25 LAB — PSA: PSA: 1.57

## 2022-08-01 DIAGNOSIS — N402 Nodular prostate without lower urinary tract symptoms: Secondary | ICD-10-CM | POA: Diagnosis not present

## 2022-08-01 DIAGNOSIS — N5201 Erectile dysfunction due to arterial insufficiency: Secondary | ICD-10-CM | POA: Diagnosis not present

## 2022-08-09 ENCOUNTER — Encounter: Payer: Self-pay | Admitting: Family Medicine

## 2022-10-01 ENCOUNTER — Encounter: Payer: Self-pay | Admitting: Family Medicine

## 2022-10-01 ENCOUNTER — Ambulatory Visit (HOSPITAL_BASED_OUTPATIENT_CLINIC_OR_DEPARTMENT_OTHER)
Admission: RE | Admit: 2022-10-01 | Discharge: 2022-10-01 | Disposition: A | Discharge status: Home / Self Care | Payer: Medicare HMO | Source: Ambulatory Visit | Attending: Family Medicine | Admitting: Family Medicine

## 2022-10-01 ENCOUNTER — Ambulatory Visit (INDEPENDENT_AMBULATORY_CARE_PROVIDER_SITE_OTHER): Payer: Medicare HMO | Admitting: Family Medicine

## 2022-10-01 VITALS — BP 132/70 | HR 68 | Temp 97.2°F | Ht 75.0 in | Wt 212.4 lb

## 2022-10-01 DIAGNOSIS — M7989 Other specified soft tissue disorders: Secondary | ICD-10-CM

## 2022-10-01 DIAGNOSIS — M79661 Pain in right lower leg: Secondary | ICD-10-CM | POA: Diagnosis not present

## 2022-10-01 NOTE — Progress Notes (Signed)
   Wesley Black is a 73 y.o. male who presents today for an office visit.  Assessment/Plan:  Right leg swelling Concern for DVT.  Reassuring exam today and no red flag signs or symptoms.  Will check stat ultrasound to further evaluate.  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  If negative will need to have him follow back up with vascular surgery.  No signs of PULMONARY EMBOLISM and has reassuring exam today-do not think we need to send him to the emergency room or check CTA at this point.  We discussed reasons to return to care and seek emergent care.    Subjective:  HPI:  See Assessment / plan for status of chronic conditions. His main concern is swelling his right leg. This started after taking a flight to [place], [place] 10 days ago. He has tried keeping his legs elevated without much improvement. Gets much worse when sitting. He has noticed some red areas to his legs. He has noticed some tingling to his legs as well. Also some pain to the bottom of his foot.        Objective:  Physical Exam: BP 132/70   Pulse 68   Temp (!) 97.2 F (36.2 C) (Temporal)   Ht 6\' 3"  (1.905 m)   Wt 212 lb 6.4 oz (96.3 kg)   SpO2 98%   BMI 26.55 kg/m   Gen: No acute distress, resting comfortably MUSCULOSKELETAL: - Right Leg: Grossly edematous compared to left.  Mild erythema.  3+ pitting edema to knee.  Wesley Black' sign negative. Neuro: Grossly normal, moves all extremities Psych: Normal affect and thought content      Wesley Black M. [name], MD 10/01/2022 2:29 PM

## 2022-10-01 NOTE — Patient Instructions (Addendum)
It was very nice to see you today!  I am concerned that you may have a blood clot.  We will check an ultrasound to further evaluate.   Return if symptoms worsen or fail to improve.   Take care, Dr [name]  PLEASE NOTE:  If you had any lab tests, please let us know if you have not heard back within a few days. You may see your results on mychart before we have a chance to review them but we will give you a call once they are reviewed by [place].   If we ordered any referrals today, please let us know if you have not heard from their office within the next week.   If you had any urgent prescriptions sent in today, please check with the pharmacy within an hour of our visit to make sure the prescription was transmitted appropriately.   Please try these tips to maintain a healthy lifestyle:  Eat at least 3 REAL meals and 1-2 snacks per day.  Aim for no more than 5 hours between eating.  If you eat breakfast, please do so within one hour of getting up.   Each meal should contain half fruits/vegetables, one quarter protein, and one quarter carbs (no bigger than a computer mouse)  Cut down on sweet beverages. This includes juice, soda, and sweet tea.   Drink at least 1 glass of water with each meal and aim for at least 8 glasses per day  Exercise at least 150 minutes every week.

## 2022-10-02 ENCOUNTER — Telehealth: Payer: Self-pay | Admitting: Family Medicine

## 2022-10-02 NOTE — Progress Notes (Signed)
Good news! He does not have a blood clot in his leg. We do not need to start any blood thinners.  As we discussed at his office visit, it would be a good idea for [place] to get him back to the vascular doctors to discuss next steps. He should be able to call to schedule an appointment however but we can place a referral if needed.  Katina Degree. [name], MD 10/02/2022 7:25 AM

## 2022-10-02 NOTE — Telephone Encounter (Signed)
Reason for Referral Request:  Leg swelling  Has Patient been seen by PCP for this complaint? Yes  No, Please schedule patient for appointment for complaint.  Yes, Please find out following information:  Reason: Ultrasound findings  Referral to which Specialty: Vein & Vascular  Preferred office/ provider:  [facility]

## 2022-10-03 NOTE — Telephone Encounter (Signed)
Please advise 

## 2022-10-04 ENCOUNTER — Other Ambulatory Visit: Payer: Self-pay | Admitting: *Deleted

## 2022-10-04 DIAGNOSIS — M7989 Other specified soft tissue disorders: Secondary | ICD-10-CM

## 2022-10-04 NOTE — Telephone Encounter (Signed)
Referral placed.

## 2022-10-04 NOTE — Telephone Encounter (Signed)
Please place referral.  Renalda Locklin M. [name], MD 10/04/2022 7:20 AM

## 2022-10-05 ENCOUNTER — Ambulatory Visit: Payer: Medicare HMO | Admitting: Family Medicine

## 2022-10-05 DIAGNOSIS — H6122 Impacted cerumen, left ear: Secondary | ICD-10-CM | POA: Diagnosis not present

## 2022-10-05 DIAGNOSIS — H9211 Otorrhea, right ear: Secondary | ICD-10-CM | POA: Diagnosis not present

## 2022-10-05 DIAGNOSIS — L299 Pruritus, unspecified: Secondary | ICD-10-CM | POA: Diagnosis not present

## 2022-10-08 DIAGNOSIS — H903 Sensorineural hearing loss, bilateral: Secondary | ICD-10-CM | POA: Diagnosis not present

## 2022-10-09 ENCOUNTER — Telehealth: Payer: Self-pay

## 2022-10-09 DIAGNOSIS — I872 Venous insufficiency (chronic) (peripheral): Secondary | ICD-10-CM

## 2022-10-09 NOTE — Telephone Encounter (Signed)
Caller: Patient  Concern: Pt took a 10.5 hr flight on 09/20/22 to [place]. During the night, while overseas, he would get OOB to go to the [place] and he had the feeling of blood rushing to his feet and the soles of his feet were painful for the first few steps. Once back home, he had severe RLE swelling and red splotches. The redness dissipated after the 1st wk. The swelling in his calves resolved a few days ago, but he still has significant swelling in feet and ankles. Elevation at HS resolves all swelling, but after a couple of hours, the feet/ankle swelling returns. He has been wearing compressions, but they seem ineffective.   Location: right leg  Description:  after long flight  Aggravating Factors: walking, standing  Treatments:  elevation, compression  Resolution: Appointment scheduled for first available  Next Appt: Appointment scheduled for 10/11/22

## 2022-10-11 ENCOUNTER — Ambulatory Visit (HOSPITAL_COMMUNITY)
Admission: RE | Admit: 2022-10-11 | Discharge: 2022-10-11 | Disposition: A | Discharge status: Home / Self Care | Payer: Medicare HMO | Source: Ambulatory Visit | Attending: Vascular Surgery | Admitting: Vascular Surgery

## 2022-10-11 ENCOUNTER — Ambulatory Visit: Payer: Medicare HMO | Admitting: Physician Assistant

## 2022-10-11 VITALS — BP 133/76 | HR 65 | Temp 98.8°F | Resp 20 | Ht 75.0 in | Wt 211.7 lb

## 2022-10-11 DIAGNOSIS — I872 Venous insufficiency (chronic) (peripheral): Secondary | ICD-10-CM | POA: Diagnosis not present

## 2022-10-11 DIAGNOSIS — I82890 Acute embolism and thrombosis of other specified veins: Secondary | ICD-10-CM

## 2022-10-11 NOTE — Progress Notes (Signed)
Office Note     CC:  follow up Requesting Provider:  Ardith Dark, MD  HPI: Wesley Black is a 72 y.o. (1950-09-05) male who presents for evaluation of right leg swelling. He recently took a trip to [place] and on the flight there had significant discomfort and swelling that started in his right leg. He explains that he tried to do all the "right" things such as wear compression, elevate his leg as best as he could as well as get up and walk around as much as he could during the flight. Upon arrival in [place] the first three days he says were extremely painful. He was convinced he had a blood clot. He says his leg felt as thought it had a fever. He was using travel wedge pillow to elevate and worked around travel plans to stay off of it as much as he could. After several days the pain did subside some but the swelling continued. Upon traveling back he initially was seen by PCP who recommended immediate evaluation for DVT. He had duplex done [facility] on 10[facility]01[facility]22 that did not show any SVT or DVT. He has had some improvement of the swelling but leg is still very swollen. He also feels that the leg fatigues very easily since his travels.    Past Medical History:  Diagnosis Date   DVT (deep venous thrombosis) (HCC)    Fracture    Multiple    Past Surgical History:  Procedure Laterality Date   COLONOSCOPY     ENDOVENOUS ABLATION SAPHENOUS VEIN W/ LASER Right 07/28/2020   endovenous laser ablation right greater saphenous vein and stab phlebectomy < 10 incisions right leg by Cari Caraway MD    KNEE ARTHROPLASTY Right 2004   LASIK Bilateral 2001   SHOULDER SURGERY Left 1984    Social History   Socioeconomic History   Marital status: Single    Spouse name: Not on file   Number of children: 0   Years of education: Master's   Highest education level: Not on file  Occupational History   Occupation: Retired  Tobacco Use   Smoking status: Never   Smokeless tobacco: Never  Vaping Use    Vaping Use: Never used  Substance and Sexual Activity   Alcohol use: Yes    Comment: 2 glasses of wine or beer per month   Drug use: No   Sexual activity: Not on file    Comment: Married  Other Topics Concern   Not on file  Social History Narrative   Lives at home, separated   Left-handed   Caffeine: 1 glass of tea per day, occasional 7 oz Pepsi (every 2 wks)      Social Determinants of Health   Financial Resource Strain: Low Risk  (04/27/2021)   Overall Financial Resource Strain (CARDIA)    Difficulty of Paying Living Expenses: Not hard at all  Food Insecurity: No Food Insecurity (04/27/2021)   Hunger Vital Sign    Worried About Running Out of Food in the Last Year: Never true    Ran Out of Food in the Last Year: Never true  Transportation Needs: No Transportation Needs (04/27/2021)   PRAPARE - Administrator, Civil Service (Medical): No    Lack of Transportation (Non-Medical): No  Physical Activity: Sufficiently Active (04/27/2021)   Exercise Vital Sign    Days of Exercise per Week: 3 days    Minutes of Exercise per Session: 90 min  Stress: No Stress Concern  Present (04/27/2021)   [employer] of Occupational Health - Occupational Stress Questionnaire    Feeling of Stress : Not at all  Social Connections: Moderately Integrated (04/27/2021)   Social Connection and Isolation Panel [NHANES]    Frequency of Communication with Friends and Family: Once a week    Frequency of Social Gatherings with Friends and Family: More than three times a week    Attends Religious Services: More than 4 times per year    Active Member of [employer] or Organizations: Yes    Attends Banker Meetings: 1 to 4 times per year    Marital Status: Never married  Intimate Partner Violence: Not At Risk (04/27/2021)   Humiliation, Afraid, Rape, and Kick questionnaire    Fear of Current or Ex-Partner: No    Emotionally Abused: No    Physically Abused: No    Sexually Abused: No     Family History  Problem Relation Age of Onset   Hypertension Mother    Arthritis Mother    Hyperlipidemia Mother    Prostate cancer Father    Cancer Father    Hyperlipidemia Father    Hypertension Father    Prostate cancer Brother    Obesity Brother    Hyperthyroidism Brother    Asthma Maternal Grandmother    Hyperlipidemia Maternal Grandfather    Hypertension Maternal Grandfather    Stroke Maternal Grandfather    COPD Paternal Grandfather    Colon cancer Neg Hx    Esophageal cancer Neg Hx    Rectal cancer Neg Hx    Stomach cancer Neg Hx     Current Outpatient Medications  Medication Sig Dispense Refill   b complex vitamins tablet Take 1 tablet by mouth daily.     methimazole (TAPAZOLE) 5 MG tablet TAKE 1/2 TABLET BY MOUTH DAILY 45 tablet 3   No current facility-administered medications for this visit.    No Known Allergies   REVIEW OF SYSTEMS:   [X]  denotes positive finding, [ ]  denotes negative finding Cardiac  Comments:  Chest pain or chest pressure:    Shortness of breath upon exertion:    Short of breath when lying flat:    Irregular heart rhythm:        Vascular    Pain in calf, thigh, or hip brought on by ambulation:    Pain in feet at night that wakes you up from your sleep:     Blood clot in your veins:    Leg swelling:  X       Pulmonary    Oxygen at home:    Productive cough:     Wheezing:         Neurologic    Sudden weakness in arms or legs:     Sudden numbness in arms or legs:     Sudden onset of difficulty speaking or slurred speech:    Temporary loss of vision in one eye:     Problems with dizziness:         Gastrointestinal    Blood in stool:     Vomited blood:         Genitourinary    Burning when urinating:     Blood in urine:        Psychiatric    Major depression:         Hematologic    Bleeding problems:    Problems with blood clotting too easily:        Skin  Rashes or ulcers:        Constitutional    Fever  or chills:      PHYSICAL EXAMINATION:  Vitals:   10/11/22 1442  BP: 133/76  Pulse: 65  Resp: 20  Temp: 98.8 F (37.1 C)  TempSrc: Temporal  SpO2: 95%  Weight: 211 lb 11.2 oz (96 kg)  Height: 6\' 3"  (1.905 m)    General:  WDWN in NAD; vital signs documented above Gait: Normal  HENT: WNL, normocephalic Pulmonary: normal non-labored breathing , without wheezing Cardiac: regular HR Abdomen: soft Vascular Exam/Pulses: 2+ distal pulses bilaterally, right leg edematous > left leg  Musculoskeletal: no muscle wasting or atrophy  Neurologic: A&O X 3 Psychiatric:  The pt has Normal affect.   Non-Invasive Vascular Imaging:    Venous Reflux Times  +--------------+--------+------+----------+-----------+--------------------  ---+  RIGHT        Reflux  Reflux  Reflux   Diameter  Comments                                No       Yes     Time       cms                              +--------------+--------+------+----------+-----------+--------------------  ---+  CFV          no                                 939 ms                    +--------------+--------+------+----------+-----------+--------------------  ---+  FV prox                yes  >1 second                                      +--------------+--------+------+----------+-----------+--------------------  ---+  FV mid                 yes  >1 second                                      +--------------+--------+------+----------+-----------+--------------------  ---+  FV dist                yes  >1 second                                      +--------------+--------+------+----------+-----------+--------------------  ---+  Popliteal             yes  >1 second                                      +--------------+--------+------+----------+-----------+--------------------  ---+  GSV at [facility]             yes   >500 ms     0.98                               +--------------+--------+------+----------+-----------+--------------------  ---+  GSV prox thigh                                   prior                                                                      ablation/stripping        +--------------+--------+------+----------+-----------+--------------------  ---+  GSV mid thigh                                    prior                                                                      ablation/stripping        +--------------+--------+------+----------+-----------+--------------------  ---+  GSV dist thigh                                   prior                                                                      ablation/stripping        +--------------+--------+------+----------+-----------+--------------------  ---+  GSV at knee                                      prior                                                                      ablation/stripping        +--------------+--------+------+----------+-----------+--------------------  ---+  GSV prox calf                                    thrombus                  +--------------+--------+------+----------+-----------+--------------------  ---+  GSV mid calf                                     thrombus                  +--------------+--------+------+----------+-----------+--------------------  ---+  GSV dist calf                                    varicsoed. Appears                                                         patent                    +--------------+--------+------+----------+-----------+--------------------  ---+  SSV Pop Fossa no                         0.233                             +--------------+--------+------+----------+-----------+--------------------  ---+  SSV prox calf no                         0.247                              +--------------+--------+------+----------+-----------+--------------------  ---+  SSV mid calf  no                         0.246                             +--------------+--------+------+----------+-----------+--------------------  ---+   Summary:  Right:  - No evidence of deep vein thrombosis seen in the right lower extremity, from the common femoral through the popliteal veins.  - Deep vein reflux in the FV and popliteal vein.  - Superficial vein reflux in the SFJ.  - Prior ablation of GSV. GSV in the calf if thrombosed proximal and mid.    ASSESSMENT/PLAN:: 72 y.o. male here for follow up for right lower extremity swelling. He has known deep venous insufficiency in the right lower extremity. He has had prior stripping of the right GSV. After recent long travel to [place] he experiences increase in swelling and discomfort in the right leg despite doing all the preventative measures. His leg has somewhat improved since returning home. He still wanted to be evaluated even after having duplex performed by PCP. His duplex today shows superficial thrombosis of the distal segment of the GSV that was not previously ablated. I discussed with him that we would not do anything different with management. I have encouraged him to use warm compresses/ soak leg in warm back for symptom relief. Can also take NSAIDs to help with discomfort. Encourage continued daily elevation and use of compression stockings. With his chronic venous insufficiency he certainly can expect lifelong swelling and some fatigue in the legs especially with prolonged activity. He would like to follow up in 6-8 weeks just to ensure that his leg continues to improve after this thrombophlebitis event.     Graceann Congress, PA-C [facility] [phone]  Clinic MD:   Edilia Bo

## 2022-11-05 ENCOUNTER — Ambulatory Visit: Payer: Medicare HMO | Admitting: Family Medicine

## 2022-11-05 ENCOUNTER — Ambulatory Visit: Payer: Medicare HMO

## 2022-11-05 ENCOUNTER — Encounter: Payer: Self-pay | Admitting: Family Medicine

## 2022-11-05 VITALS — BP 132/70 | HR 55 | Temp 97.6°F | Ht 75.0 in | Wt 208.2 lb

## 2022-11-05 VITALS — BP 122/78 | HR 58 | Temp 98.6°F | Wt 208.0 lb

## 2022-11-05 DIAGNOSIS — Z0001 Encounter for general adult medical examination with abnormal findings: Secondary | ICD-10-CM | POA: Diagnosis not present

## 2022-11-05 DIAGNOSIS — E0590 Thyrotoxicosis, unspecified without thyrotoxic crisis or storm: Secondary | ICD-10-CM

## 2022-11-05 DIAGNOSIS — Z131 Encounter for screening for diabetes mellitus: Secondary | ICD-10-CM

## 2022-11-05 DIAGNOSIS — I872 Venous insufficiency (chronic) (peripheral): Secondary | ICD-10-CM

## 2022-11-05 DIAGNOSIS — Z13220 Encounter for screening for lipoid disorders: Secondary | ICD-10-CM

## 2022-11-05 DIAGNOSIS — L219 Seborrheic dermatitis, unspecified: Secondary | ICD-10-CM | POA: Diagnosis not present

## 2022-11-05 DIAGNOSIS — Z125 Encounter for screening for malignant neoplasm of prostate: Secondary | ICD-10-CM

## 2022-11-05 DIAGNOSIS — Z0000 Encounter for general adult medical examination without abnormal findings: Secondary | ICD-10-CM | POA: Diagnosis not present

## 2022-11-05 LAB — CBC
HCT: 45.2 % (ref 39.0–52.0)
Hemoglobin: 14.9 g/dL (ref 13.0–17.0)
MCHC: 32.9 g/dL (ref 30.0–36.0)
MCV: 93.2 fl (ref 78.0–100.0)
Platelets: 166 10*3/uL (ref 150.0–400.0)
RBC: 4.84 Mil/uL (ref 4.22–5.81)
RDW: 13.9 % (ref 11.5–15.5)
WBC: 5.8 10*3/uL (ref 4.0–10.5)

## 2022-11-05 LAB — COMPREHENSIVE METABOLIC PANEL
ALT: 9 U/L (ref 0–53)
AST: 19 U/L (ref 0–37)
Albumin: 4.2 g/dL (ref 3.5–5.2)
Alkaline Phosphatase: 69 U/L (ref 39–117)
BUN: 27 mg/dL — ABNORMAL HIGH (ref 6–23)
CO2: 29 mEq/L (ref 19–32)
Calcium: 9.6 mg/dL (ref 8.4–10.5)
Chloride: 105 mEq/L (ref 96–112)
Creatinine, Ser: 1.2 mg/dL (ref 0.40–1.50)
GFR: 61.53 mL/min (ref >60.00)
Glucose, Bld: 87 mg/dL (ref 70–99)
Potassium: 5 mEq/L (ref 3.5–5.1)
Sodium: 142 mEq/L (ref 135–145)
Total Bilirubin: 0.7 mg/dL (ref 0.2–1.2)
Total Protein: 6.6 g/dL (ref 6.0–8.3)

## 2022-11-05 LAB — PSA: PSA: 1.95 ng/mL (ref 0.10–4.00)

## 2022-11-05 LAB — LIPID PANEL
Cholesterol: 182 mg/dL (ref 0–200)
HDL: 68.2 mg/dL (ref >39.00)
LDL Cholesterol: 92 mg/dL (ref 0–99)
NonHDL: 113.82
Total CHOL/HDL Ratio: 3
Triglycerides: 110 mg/dL (ref 0.0–149.0)
VLDL: 22 mg/dL (ref 0.0–40.0)

## 2022-11-05 LAB — HEMOGLOBIN A1C: Hgb A1c MFr Bld: 5.3 % (ref 4.6–6.5)

## 2022-11-05 LAB — TSH: TSH: 1.24 u[IU]/mL (ref 0.35–5.50)

## 2022-11-05 MED ORDER — KETOCONAZOLE 2 % EX CREA: 1.0000 | TOPICAL_CREAM | Freq: Two times a day (BID) | CUTANEOUS | 0 refills | Status: DC

## 2022-11-05 NOTE — Patient Instructions (Signed)
It was very nice to see you today!  Please try the ketoconazole for your rash in your ears. 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   Please keep working on the exercises.  We will check blood work.   Return in about 1 year (around 11/05/2023) for Annual Physical.   Take care, Dr [name]  PLEASE NOTE:  If you had any lab tests, please let us know if you have not heard back within a few days. You may see your results on mychart before we have a chance to review them but we will give you a call once they are reviewed by [place].   If we ordered any referrals today, please let us know if you have not heard from their office within the next week.   If you had any urgent prescriptions sent in today, please check with the pharmacy within an hour of our visit to make sure the prescription was transmitted appropriately.   Please try these tips to maintain a healthy lifestyle:  Eat at least 3 REAL meals and 1-2 snacks per day.  Aim for no more than 5 hours between eating.  If you eat breakfast, please do so within one hour of getting up.   Each meal should contain half fruits/vegetables, one quarter protein, and one quarter carbs (no bigger than a computer mouse)  Cut down on sweet beverages. This includes juice, soda, and sweet tea.   Drink at least 1 glass of water with each meal and aim for at least 8 glasses per day  Exercise at least 150 minutes every week.    Preventive Care 27 Years and Older, Male Preventive care refers to lifestyle choices and visits with your health care provider that can promote health and wellness. Preventive care visits are also called wellness exams. What can I expect for my preventive care visit? Counseling During your preventive care visit, your health care provider may ask about your: Medical history, including: Past medical problems. Family medical history. History of falls. Current health, including: Emotional well-being. Home life and relationship  well-being. Sexual activity. Memory and ability to understand (cognition). Lifestyle, including: Alcohol, nicotine or tobacco, and drug use. Access to firearms. Diet, exercise, and sleep habits. Work and work Astronomer. Sunscreen use. Safety issues such as seatbelt and bike helmet use. Physical exam Your health care provider will check your: Height and weight. These may be used to calculate your BMI (body mass index). BMI is a measurement that tells if you are at a healthy weight. Waist circumference. This measures the distance around your waistline. This measurement also tells if you are at a healthy weight and may help predict your risk of certain diseases, such as type 2 diabetes and high blood pressure. Heart rate and blood pressure. Body temperature. Skin for abnormal spots. What immunizations do I need?  Vaccines are usually given at various ages, according to a schedule. Your health care provider will recommend vaccines for you based on your age, medical history, and lifestyle or other factors, such as travel or where you work. What tests do I need? Screening Your health care provider may recommend screening tests for certain conditions. This may include: Lipid and cholesterol levels. Diabetes screening. This is done by checking your blood sugar (glucose) after you have not eaten for a while (fasting). Hepatitis C test. Hepatitis B test. HIV (human immunodeficiency virus) test. STI (sexually transmitted infection) testing, if you are at risk. Lung cancer screening. Colorectal cancer screening. Prostate cancer screening. Abdominal aortic aneurysm (  AAA) screening. You may need this if you are a current or former smoker. Talk with your health care provider about your test results, treatment options, and if necessary, the need for more tests. Follow these instructions at home: Eating and drinking  Eat a diet that includes fresh fruits and vegetables, whole grains, lean  protein, and low-fat dairy products. Limit your intake of foods with high amounts of sugar, saturated fats, and salt. Take vitamin and mineral supplements as recommended by your health care provider. Do not drink alcohol if your health care provider tells you not to drink. If you drink alcohol: Limit how much you have to 0-2 drinks a day. Know how much alcohol is in your drink. In the [place] one drink equals one 12 oz bottle of beer (355 mL), one 5 oz glass of wine (148 mL), or one 1 oz glass of hard liquor (44 mL). Lifestyle Brush your teeth every morning and night with fluoride toothpaste. Floss one time each day. Exercise for at least 30 minutes 5 or more days each week. Do not use any products that contain nicotine or tobacco. These products include cigarettes, chewing tobacco, and vaping devices, such as e-cigarettes. If you need help quitting, ask your health care provider. Do not use drugs. If you are sexually active, practice safe sex. Use a condom or other form of protection to prevent STIs. Take aspirin only as told by your health care provider. Make sure that you understand how much to take and what form to take. Work with your health care provider to find out whether it is safe and beneficial for you to take aspirin daily. Ask your health care provider if you need to take a cholesterol-lowering medicine (statin). Find healthy ways to manage stress, such as: Meditation, yoga, or listening to music. Journaling. Talking to a trusted person. Spending time with friends and family. Safety Always wear your seat belt while driving or riding in a vehicle. Do not drive: If you have been drinking alcohol. Do not ride with someone who has been drinking. When you are tired or distracted. While texting. If you have been using any mind-altering substances or drugs. Wear a helmet and other protective equipment during sports activities. If you have firearms in your house, make sure you follow  all gun safety procedures. Minimize exposure to UV radiation to reduce your risk of skin cancer. What's next? Visit your health care provider once a year for an annual wellness visit. Ask your health care provider how often you should have your eyes and teeth checked. Stay up to date on all vaccines. This information is not intended to replace advice given to you by your health care provider. Make sure you discuss any questions you have with your health care provider. Document Revised: 09/28/2020 Document Reviewed: 09/28/2020 Elsevier Patient Education  2024 [employer].

## 2022-11-05 NOTE — Progress Notes (Signed)
Subjective:   Wesley Black is a 72 y.o. male who presents for Medicare Annual/Subsequent preventive examination.  Visit Complete: In person  Patient Medicare AWV questionnaire was completed by the patient on 11/01/22; I have confirmed that all information answered by patient is correct and no changes since this date.  Review of Systems     Cardiac Risk Factors include: advanced age (>67men, >41 women);male gender     Objective:    Today's Vitals   11/05/22 0944  BP: 122/78  Pulse: (!) 58  Temp: 98.6 F (37 C)  SpO2: 96%  Weight: 208 lb (94.3 kg)   Body mass index is 26 kg/m.     11/05/2022    9:53 AM 04/27/2021    8:06 AM 04/21/2020    1:04 PM 03/06/2019    1:54 PM  Advanced Directives  Does Patient Have a Medical Advance Directive? Yes Yes Yes Yes  Type of Estate agent of [place];Living will Living will Healthcare Power of [place];Living will   Copy of Healthcare Power of Attorney in Chart? No - copy requested  No - copy requested     Current Medications (verified) Outpatient Encounter Medications as of 11/05/2022  Medication Sig   aspirin EC 81 MG tablet Take 81 mg by mouth daily. Swallow whole.   b complex vitamins tablet Take 1 tablet by mouth daily.   ketoconazole (NIZORAL) 2 % cream Apply 1 Application topically 2 (two) times daily.   methimazole (TAPAZOLE) 5 MG tablet TAKE 1/2 TABLET BY MOUTH DAILY   No facility-administered encounter medications on file as of 11/05/2022.    Allergies (verified) Patient has no known allergies.   History: Past Medical History:  Diagnosis Date   DVT (deep venous thrombosis) (HCC)    Fracture    Multiple   Past Surgical History:  Procedure Laterality Date   COLONOSCOPY     ENDOVENOUS ABLATION SAPHENOUS VEIN W/ LASER Right 07/28/2020   endovenous laser ablation right greater saphenous vein and stab phlebectomy < 10 incisions right leg by Wesley Caraway MD    KNEE ARTHROPLASTY Right 2004   LASIK  Bilateral 2001   SHOULDER SURGERY Left 1984   Family History  Problem Relation Age of Onset   Hypertension Mother    Arthritis Mother    Hyperlipidemia Mother    Prostate cancer Father    Cancer Father    Hyperlipidemia Father    Hypertension Father    Prostate cancer Brother    Obesity Brother    Hyperthyroidism Brother    Asthma Maternal Grandmother    Hyperlipidemia Maternal Grandfather    Hypertension Maternal Grandfather    Stroke Maternal Grandfather    COPD Paternal Grandfather    Colon cancer Neg Hx    Esophageal cancer Neg Hx    Rectal cancer Neg Hx    Stomach cancer Neg Hx    Social History   Socioeconomic History   Marital status: Single    Spouse name: Not on file   Number of children: 0   Years of education: Master's   Highest education level: Not on file  Occupational History   Occupation: Retired  Tobacco Use   Smoking status: Never   Smokeless tobacco: Never  Vaping Use   Vaping status: Never Used  Substance and Sexual Activity   Alcohol use: Yes    Comment: 2 glasses of wine or beer per month   Drug use: No   Sexual activity: Not on file  Comment: Married  Other Topics Concern   Not on file  Social History Narrative   Lives at home, separated   Left-handed   Caffeine: 1 glass of tea per day, occasional 7 oz Pepsi (every 2 wks)      Social Determinants of Health   Financial Resource Strain: Low Risk  (11/01/2022)   Overall Financial Resource Strain (CARDIA)    Difficulty of Paying Living Expenses: Not hard at all  Food Insecurity: No Food Insecurity (11/01/2022)   Hunger Vital Sign    Worried About Running Out of Food in the Last Year: Never true    Ran Out of Food in the Last Year: Never true  Transportation Needs: No Transportation Needs (11/01/2022)   PRAPARE - Administrator, Civil Service (Medical): No    Lack of Transportation (Non-Medical): No  Physical Activity: Sufficiently Active (11/01/2022)   Exercise Vital Sign     Days of Exercise per Week: 3 days    Minutes of Exercise per Session: 100 min  Stress: No Stress Concern Present (11/01/2022)   [employer] of Occupational Health - Occupational Stress Questionnaire    Feeling of Stress : Not at all  Social Connections: Unknown (11/01/2022)   Social Connection and Isolation Panel [NHANES]    Frequency of Communication with Friends and Family: Patient declined    Frequency of Social Gatherings with Friends and Family: Patient declined    Attends Religious Services: More than 4 times per year    Active Member of [employer] or Organizations: Yes    Attends Engineer, structural: More than 4 times per year    Marital Status: Divorced    Tobacco Counseling Counseling given: Not Answered   Clinical Intake:  Pre-visit preparation completed: Yes  Pain : No/denies pain     BMI - recorded: 26 Nutritional Status: BMI 25 -29 Overweight Nutritional Risks: None Diabetes: No  How often do you need to have someone help you when you read instructions, pamphlets, or other written materials from your doctor or pharmacy?: 1 - Never  Interpreter Needed?: No  Information entered by :: Lanier Ensign, LPN   Activities of Daily Living    11/01/2022    4:44 PM 04/30/2022    7:33 AM  In your present state of health, do you have any difficulty performing the following activities:  Hearing? 0 0  Vision? 0 0  Difficulty concentrating or making decisions? 0 0  Walking or climbing stairs? 0 0  Dressing or bathing? 0 0  Doing errands, shopping? 0 0  Preparing Food and eating ? N N  Using the Toilet? N N  In the past six months, have you accidently leaked urine? N N  Do you have problems with loss of bowel control? N N  Managing your Medications? N N  Managing your Finances? N N  Housekeeping or managing your Housekeeping? N N    Patient Care Team: Wesley Dark, MD as PCP - General (Family Medicine) Wesley Bastos, MD as Consulting  Physician (Family Medicine) Wesley Quill, MD as Attending Physician (Ophthalmology) Wesley Quill, MD as Attending Physician (Ophthalmology)  Indicate any recent Medical Services you may have received from other than [facility] providers in the past year (date may be approximate).     Assessment:   This is a routine wellness examination for Wesley Black.  Hearing/Vision screen Hearing Screening - Comments:: Pt stated he has right ear damage  Vision Screening - Comments:: Pt follows up  with Dr [place] for annual  eye exams   Dietary issues and exercise activities discussed:     Goals Addressed             This Visit's Progress    Patient Stated       Continue to maintain health        Depression Screen    11/05/2022    9:52 AM 11/05/2022    8:06 AM 10/01/2022    2:14 PM 03/29/2022   10:10 AM 11/29/2021    8:16 AM 04/27/2021    8:04 AM 11/28/2020    9:13 AM  PHQ 2/9 Scores  PHQ - 2 Score 0 0 0 0 0 0 2  PHQ- 9 Score 0 0 0 0   3    Fall Risk    11/05/2022    8:06 AM 11/01/2022    4:44 PM 10/01/2022    2:15 PM 04/30/2022    7:33 AM 04/27/2021    8:08 AM  Fall Risk   Falls in the past year? 0 0 0 0 0  Number falls in past yr: 0 0 0 0 0  Injury with Fall? 0 0 0 0 0  Risk for fall due to : No Fall Risks Impaired vision No Fall Risks    Follow up Falls evaluation completed Falls prevention discussed   Falls prevention discussed    MEDICARE RISK AT HOME:   TIMED UP AND GO:  Was the test performed?  Yes  Length of time to ambulate 10 feet: 10 sec Gait steady and fast without use of assistive device    Cognitive Function:        11/05/2022    9:56 AM 04/27/2021    8:10 AM 04/21/2020    1:10 PM  6CIT Screen  What Year? 0 points 0 points 0 points  What month? 0 points 0 points 0 points  What time? 0 points 0 points   Count back from 20 0 points 0 points 0 points  Months in reverse 0 points 0 points 0 points  Repeat phrase 0 points 0 points 0 points  Total Score 0  points 0 points     Immunizations Immunization History  Administered Date(s) Administered   Fluad Quad(high Dose 65+) 03/06/2019, 02/16/2020, 02/21/2021, 03/29/2022   Influenza,inj,Quad PF,6+ Mos 12/12/2017   Moderna Sars-Covid-2 Vaccination 05/12/2019, 06/09/2019   PFIZER(Purple Top)SARS-COV-2 Vaccination 03/04/2020   Pneumococcal Conjugate-13 12/12/2017   Pneumococcal Polysaccharide-23 03/06/2019   Tdap 11/18/2018    TDAP status: Up to date  Flu Vaccine status: Up to date  Pneumococcal vaccine status: Up to date  Covid-19 vaccine status: Completed vaccines  Qualifies for Shingles Vaccine? Yes   Zostavax completed No   Shingrix Completed?: No.    Education has been provided regarding the importance of this vaccine. Patient has been advised to call insurance company to determine out of pocket expense if they have not yet received this vaccine. Advised may also receive vaccine at local pharmacy or Health Dept. Verbalized acceptance and understanding.  Screening Tests Health Maintenance  Topic Date Due   COVID-19 Vaccine (4 - 2023-24 season) 11/21/2022 (Originally 12/15/2021)   Zoster Vaccines- Shingrix (1 of 2) 01/01/2023 (Originally 12/07/1969)   INFLUENZA VACCINE  11/15/2022   Medicare Annual Wellness (AWV)  11/05/2023   DTaP/Tdap/Td (2 - Td or Tdap) 11/17/2028   Colonoscopy  03/23/2029   Pneumonia Vaccine 76+ Years old  Completed   Hepatitis C Screening  Completed   HPV VACCINES  Aged Out   Fecal DNA (Cologuard)  Discontinued    Health Maintenance  There are no preventive care reminders to display for this patient.   Colorectal cancer screening: Type of screening: Colonoscopy. Completed 03/24/19. Repeat every 10 years   Additional Screening:  Hepatitis C Screening:  Completed 11/18/18  Vision Screening: Recommended annual ophthalmology exams for early detection of glaucoma and other disorders of the eye. Is the patient up to date with their annual eye exam?  Yes   Who is the provider or what is the name of the office in which the patient attends annual eye exams? [facility]  If pt is not established with a provider, would they like to be referred to a provider to establish care? No .   Dental Screening: Recommended annual dental exams for proper oral hygiene    Community Resource Referral / Chronic Care Management: CRR required this visit?  No   CCM required this visit?  No     Plan:     I have personally reviewed and noted the following in the patient's chart:   Medical and social history Use of alcohol, tobacco or illicit drugs  Current medications and supplements including opioid prescriptions. Patient is not currently taking opioid prescriptions. Functional ability and status Nutritional status Physical activity Advanced directives List of other physicians Hospitalizations, surgeries, and ER visits in previous 12 months Vitals Screenings to include cognitive, depression, and falls Referrals and appointments  In addition, I have reviewed and discussed with patient certain preventive protocols, quality metrics, and best practice recommendations. A written personalized care plan for preventive services as well as general preventive health recommendations were provided to patient.     Marzella Schlein, LPN   1/61/0960   After Visit Summary: (MyChart) Due to this being a telephonic visit, the after visit summary with patients personalized plan was offered to patient via MyChart   Nurse Notes: none

## 2022-11-05 NOTE — Assessment & Plan Note (Signed)
Follows with endocrinology.  On methimazole 2.5 mg daily. Check TSH.

## 2022-11-05 NOTE — Patient Instructions (Signed)
Wesley Black , Thank you for taking time to come for your Medicare Wellness Visit. I appreciate your ongoing commitment to your health goals. Please review the following plan we discussed and let me know if I can assist you in the future.   These are the goals we discussed:  Goals      Patient Stated     Continue on  regimen of good health and exercise     Patient Stated     Continue to remain healthy         This is a list of the screening recommended for you and due dates:  Health Maintenance  Topic Date Due   COVID-19 Vaccine (4 - 2023-24 season) 11/21/2022*   Zoster (Shingles) Vaccine (1 of 2) 01/01/2023*   Flu Shot  11/15/2022   Medicare Annual Wellness Visit  11/05/2023   DTaP/Tdap/Td vaccine (2 - Td or Tdap) 11/17/2028   Colon Cancer Screening  03/23/2029   Pneumonia Vaccine  Completed   Hepatitis C Screening  Completed   HPV Vaccine  Aged Out   Cologuard (Stool DNA test)  Discontinued  *Topic was postponed. The date shown is not the original due date.    Advanced directives: Please bring a copy of your health care power of attorney and living will to the office at your convenience.  Conditions/risks identified: continue to maintain health daily   Next appointment: Follow up in one year for your annual wellness visit.   Preventive Care 11 Years and Older, Male  Preventive care refers to lifestyle choices and visits with your health care provider that can promote health and wellness. What does preventive care include? A yearly physical exam. This is also called an annual well check. Dental exams once or twice a year. Routine eye exams. Ask your health care provider how often you should have your eyes checked. Personal lifestyle choices, including: Daily care of your teeth and gums. Regular physical activity. Eating a healthy diet. Avoiding tobacco and drug use. Limiting alcohol use. Practicing safe sex. Taking low doses of aspirin every day. Taking vitamin and  mineral supplements as recommended by your health care provider. What happens during an annual well check? The services and screenings done by your health care provider during your annual well check will depend on your age, overall health, lifestyle risk factors, and family history of disease. Counseling  Your health care provider may ask you questions about your: Alcohol use. Tobacco use. Drug use. Emotional well-being. Home and relationship well-being. Sexual activity. Eating habits. History of falls. Memory and ability to understand (cognition). Work and work Astronomer. Screening  You may have the following tests or measurements: Height, weight, and BMI. Blood pressure. Lipid and cholesterol levels. These may be checked every 5 years, or more frequently if you are over 15 years old. Skin check. Lung cancer screening. You may have this screening every year starting at age 53 if you have a 30-pack-year history of smoking and currently smoke or have quit within the past 15 years. Fecal occult blood test (FOBT) of the stool. You may have this test every year starting at age 91. Flexible sigmoidoscopy or colonoscopy. You may have a sigmoidoscopy every 5 years or a colonoscopy every 10 years starting at age 46. Prostate cancer screening. Recommendations will vary depending on your family history and other risks. Hepatitis C blood test. Hepatitis B blood test. Sexually transmitted disease (STD) testing. Diabetes screening. This is done by checking your blood sugar (glucose) after  you have not eaten for a while (fasting). You may have this done every 1-3 years. Abdominal aortic aneurysm (AAA) screening. You may need this if you are a current or former smoker. Osteoporosis. You may be screened starting at age 58 if you are at high risk. Talk with your health care provider about your test results, treatment options, and if necessary, the need for more tests. Vaccines  Your health care  provider may recommend certain vaccines, such as: Influenza vaccine. This is recommended every year. Tetanus, diphtheria, and acellular pertussis (Tdap, Td) vaccine. You may need a Td booster every 10 years. Zoster vaccine. You may need this after age 26. Pneumococcal 13-valent conjugate (PCV13) vaccine. One dose is recommended after age 46. Pneumococcal polysaccharide (PPSV23) vaccine. One dose is recommended after age 50. Talk to your health care provider about which screenings and vaccines you need and how often you need them. This information is not intended to replace advice given to you by your health care provider. Make sure you discuss any questions you have with your health care provider. Document Released: 04/29/2015 Document Revised: 12/21/2015 Document Reviewed: 02/01/2015 Elsevier Interactive Patient Education  2017 [employer].  Fall Prevention in the Home Falls can cause injuries. They can happen to people of all ages. There are many things you can do to make your home safe and to help prevent falls. What can I do on the outside of my home? Regularly fix the edges of walkways and driveways and fix any cracks. Remove anything that might make you trip as you walk through a door, such as a raised step or threshold. Trim any bushes or trees on the path to your home. Use bright outdoor lighting. Clear any walking paths of anything that might make someone trip, such as rocks or tools. Regularly check to see if handrails are loose or broken. Make sure that both sides of any steps have handrails. Any raised decks and porches should have guardrails on the edges. Have any leaves, snow, or ice cleared regularly. Use sand or salt on walking paths during winter. Clean up any spills in your garage right away. This includes oil or grease spills. What can I do in the bathroom? Use night lights. Install grab bars by the toilet and in the tub and shower. Do not use towel bars as grab  bars. Use non-skid mats or decals in the tub or shower. If you need to sit down in the shower, use a plastic, non-slip stool. Keep the floor dry. Clean up any water that spills on the floor as soon as it happens. Remove soap buildup in the tub or shower regularly. Attach bath mats securely with double-sided non-slip rug tape. Do not have throw rugs and other things on the floor that can make you trip. What can I do in the bedroom? Use night lights. Make sure that you have a light by your bed that is easy to reach. Do not use any sheets or blankets that are too big for your bed. They should not hang down onto the floor. Have a firm chair that has side arms. You can use this for support while you get dressed. Do not have throw rugs and other things on the floor that can make you trip. What can I do in the kitchen? Clean up any spills right away. Avoid walking on wet floors. Keep items that you use a lot in easy-to-reach places. If you need to reach something above you, use a strong  step stool that has a grab bar. Keep electrical cords out of the way. Do not use floor polish or wax that makes floors slippery. If you must use wax, use non-skid floor wax. Do not have throw rugs and other things on the floor that can make you trip. What can I do with my stairs? Do not leave any items on the stairs. Make sure that there are handrails on both sides of the stairs and use them. Fix handrails that are broken or loose. Make sure that handrails are as long as the stairways. Check any carpeting to make sure that it is firmly attached to the stairs. Fix any carpet that is loose or worn. Avoid having throw rugs at the top or bottom of the stairs. If you do have throw rugs, attach them to the floor with carpet tape. Make sure that you have a light switch at the top of the stairs and the bottom of the stairs. If you do not have them, ask someone to add them for you. What else can I do to help prevent  falls? Wear shoes that: Do not have high heels. Have rubber bottoms. Are comfortable and fit you well. Are closed at the toe. Do not wear sandals. If you use a stepladder: Make sure that it is fully opened. Do not climb a closed stepladder. Make sure that both sides of the stepladder are locked into place. Ask someone to hold it for you, if possible. Clearly mark and make sure that you can see: Any grab bars or handrails. First and last steps. Where the edge of each step is. Use tools that help you move around (mobility aids) if they are needed. These include: Canes. Walkers. Scooters. Crutches. Turn on the lights when you go into a dark area. Replace any light bulbs as soon as they burn out. Set up your furniture so you have a clear path. Avoid moving your furniture around. If any of your floors are uneven, fix them. If there are any pets around you, be aware of where they are. Review your medicines with your doctor. Some medicines can make you feel dizzy. This can increase your chance of falling. Ask your doctor what other things that you can do to help prevent falls. This information is not intended to replace advice given to you by your health care provider. Make sure you discuss any questions you have with your health care provider. Document Released: 01/27/2009 Document Revised: 09/08/2015 Document Reviewed: 05/07/2014 Elsevier Interactive Patient Education  2017 [employer].

## 2022-11-05 NOTE — Assessment & Plan Note (Signed)
No red flags.  Start topical ketoconazole.  He will let me know if not improving in a couple of weeks.

## 2022-11-05 NOTE — Assessment & Plan Note (Addendum)
Had a lengthy discussion with patient today regarding his right leg edema. He has had some improvement with conservative measures. He will follow up with vascular surgery again in a few weeks. They may be considering a lymphedema evaluation. May be getting SCDs as well. We did discuss continued conservative management including leg elevation, compression, salt avoidance and frequent exercise.

## 2022-11-05 NOTE — Progress Notes (Signed)
Chief Complaint:  Wesley Black is a 72 y.o. male who presents today for his annual comprehensive physical exam.    Assessment/Plan:  Chronic Problems Addressed Today: Chronic venous insufficiency Had a lengthy discussion with patient today regarding his right leg edema. He has had some improvement with conservative measures. He will follow up with vascular surgery again in a few weeks. They may be considering a lymphedema evaluation. May be getting SCDs as well. We did discuss continued conservative management including leg elevation, compression, salt avoidance and frequent exercise.   Hyperthyroidism Follows with endocrinology.  On methimazole 2.5 mg daily. Check TSH.   Seborrheic dermatitis No red flags.  Start topical ketoconazole.  He will let me know if not improving in a couple of weeks.  Preventative Healthcare: Check labs.  Up-to-date on vaccines and cancer screenings.  Has AWV later today.  Patient Counseling(The following topics were reviewed and/or handout was given):  -Nutrition: Stressed importance of moderation in sodium/caffeine intake, saturated fat and cholesterol, caloric balance, sufficient intake of fresh fruits, vegetables, and fiber.  -Stressed the importance of regular exercise.   -Substance Abuse: Discussed cessation/primary prevention of tobacco, alcohol, or other drug use; driving or other dangerous activities under the influence; availability of treatment for abuse.   -Injury prevention: Discussed safety belts, safety helmets, smoke detector, smoking near bedding or upholstery.   -Sexuality: Discussed sexually transmitted diseases, partner selection, use of condoms, avoidance of unintended pregnancy and contraceptive alternatives.   -Dental health: Discussed importance of regular tooth brushing, flossing, and dental visits.  -Health maintenance and immunizations reviewed. Please refer to Health maintenance section.  Return to care in 1 year for next  preventative visit.     Subjective:  HPI:  He has no acute complaints today. See Assessment / plan for status of chronic conditions.   He was seen here a few weeks ago for leg edema. We were concerned about DVT due to recent international travel. We got an ultrasound which was negative for DVT but did should cutaneous edema. He did end up following up with vascular and they repeated an ultrasound which showed a superficial thrombus. He was recommended to start conservative management. This does seem to be improving. He has noticed that his symptoms improve dramatically after intense exercise however this effect is short lived.   He has also noticed irritation around hears. This has been going on for months to years. Some flaking as well.   Lifestyle Diet: Balanced. Plenty of fruits and vegetables. Trying to cut down on sodium.  Exercise: Cycling routinely multiple times per week. .      11/05/2022    8:06 AM  Depression screen PHQ 2/9  Decreased Interest 0  Down, Depressed, Hopeless 0  PHQ - 2 Score 0  Altered sleeping 0  Tired, decreased energy 0  Change in appetite 0  Feeling bad or failure about yourself  0  Trouble concentrating 0  Moving slowly or fidgety/restless 0  Suicidal thoughts 0  PHQ-9 Score 0  Difficult doing work/chores Not difficult at all    Health Maintenance Due  Topic Date Due   Medicare Annual Wellness (AWV)  04/27/2022     ROS: Per HPI, otherwise a complete review of systems was negative.   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   Past Surgical History:  Procedure Laterality Date   COLONOSCOPY     ENDOVENOUS ABLATION SAPHENOUS VEIN W/ LASER Right 07/28/2020   endovenous laser ablation right greater saphenous vein and stab phlebectomy < 10 incisions right leg by Cari Caraway MD    KNEE ARTHROPLASTY Right 2004   LASIK Bilateral 2001   SHOULDER SURGERY Left 1984    Family History  Problem Relation Age of Onset   Hypertension Mother    Arthritis Mother    Hyperlipidemia Mother    Prostate cancer Father    Cancer Father    Hyperlipidemia Father    Hypertension Father    Prostate cancer Brother    Obesity Brother    Hyperthyroidism Brother    Asthma Maternal Grandmother    Hyperlipidemia Maternal Grandfather    Hypertension Maternal Grandfather    Stroke Maternal Grandfather    COPD Paternal Grandfather    Colon cancer Neg Hx    Esophageal cancer Neg Hx    Rectal cancer Neg Hx    Stomach cancer Neg Hx     Medications- reviewed and updated Current Outpatient Medications  Medication Sig Dispense Refill   aspirin EC 81 MG tablet Take 81 mg by mouth daily. Swallow whole.     b complex vitamins tablet Take 1 tablet by mouth daily.     ketoconazole (NIZORAL) 2 % cream Apply 1 Application topically 2 (two) times daily. 60 g 0   methimazole (TAPAZOLE) 5 MG tablet TAKE 1/2 TABLET BY MOUTH DAILY 45 tablet 3   No current facility-administered medications for this visit.    Allergies-reviewed and updated No Known Allergies  Social History   Socioeconomic History   Marital status: Single    Spouse name: Not on file   Number of children: 0   Years of education: Master's   Highest education level: Not on file  Occupational History   Occupation: Retired  Tobacco Use   Smoking status: Never   Smokeless tobacco: Never  Vaping Use   Vaping status: Never Used  Substance and Sexual Activity    Alcohol use: Yes    Comment: 2 glasses of wine or beer per month   Drug use: No   Sexual activity: Not on file    Comment: Married  Other Topics Concern   Not on file  Social History Narrative   Lives at home, separated   Left-handed   Caffeine: 1 glass of tea per day, occasional 7 oz Pepsi (every 2 wks)      Social Determinants of Health   Financial Resource Strain: Low Risk  (04/27/2021)   Overall Financial Resource Strain (CARDIA)    Difficulty of Paying Living Expenses: Not hard at all  Food Insecurity: No Food Insecurity (04/27/2021)   Hunger Vital Sign    Worried About Running Out of Food in the Last Year: Never true    Ran Out of Food in the Last Year: Never true  Transportation Needs: No Transportation Needs (04/27/2021)   PRAPARE - Administrator, Civil Service (Medical): No    Lack of Transportation (Non-Medical): No  Physical Activity: Sufficiently Active (04/27/2021)  Exercise Vital Sign    Days of Exercise per Week: 3 days    Minutes of Exercise per Session: 90 min  Stress: No Stress Concern Present (04/27/2021)   [employer] of Occupational Health - Occupational Stress Questionnaire    Feeling of Stress : Not at all  Social Connections: Moderately Integrated (04/27/2021)   Social Connection and Isolation Panel [NHANES]    Frequency of Communication with Friends and Family: Once a week    Frequency of Social Gatherings with Friends and Family: More than three times a week    Attends Religious Services: More than 4 times per year    Active Member of [employer] or Organizations: Yes    Attends Banker Meetings: 1 to 4 times per year    Marital Status: Never married        Objective:  Physical Exam: BP 132/70   Pulse (!) 55   Temp 97.6 F (36.4 C)   Ht 6\' 3"  (1.905 m)   Wt 208 lb 3.2 oz (94.4 kg)   SpO2 97%   BMI 26.02 kg/m   Body mass index is 26.02 kg/m. Wt Readings from Last 3 Encounters:  11/05/22 208 lb 3.2 oz (94.4 kg)   10/11/22 211 lb 11.2 oz (96 kg)  10/01/22 212 lb 6.4 oz (96.3 kg)   Gen: NAD, resting comfortably HEENT: TMs normal bilaterally. OP clear. No thyromegaly noted. EAC with seb dermatitis bilaterally.  CV: RRR with no murmurs appreciated Pulm: NWOB, CTAB with no crackles, wheezes, or rhonchi GI: Normal bowel sounds present. Soft, Nontender, Nondistended. MSK: 1+ edema to right knee, cyanosis, or clubbing noted Skin: warm, dry Neuro: CN2-12 grossly intact. Strength 5/5 in upper and lower extremities. Reflexes symmetric and intact bilaterally.  Psych: Normal affect and thought content     Abir Eroh M. [name], MD 11/05/2022 9:04 AM

## 2022-11-06 NOTE — Progress Notes (Signed)
Great news!  Labs are all stable.  Do not need to make any changes to treatment plan.  He should keep up the great work and we can recheck in a year or so.

## 2022-11-12 DIAGNOSIS — Z86718 Personal history of other venous thrombosis and embolism: Secondary | ICD-10-CM | POA: Diagnosis not present

## 2022-11-12 DIAGNOSIS — E0590 Thyrotoxicosis, unspecified without thyrotoxic crisis or storm: Secondary | ICD-10-CM | POA: Diagnosis not present

## 2022-11-12 DIAGNOSIS — N401 Enlarged prostate with lower urinary tract symptoms: Secondary | ICD-10-CM | POA: Diagnosis not present

## 2022-11-12 DIAGNOSIS — D696 Thrombocytopenia, unspecified: Secondary | ICD-10-CM | POA: Diagnosis not present

## 2022-11-12 DIAGNOSIS — N1831 Chronic kidney disease, stage 3a: Secondary | ICD-10-CM | POA: Diagnosis not present

## 2022-11-29 ENCOUNTER — Encounter: Payer: Self-pay | Admitting: Internal Medicine

## 2022-11-29 ENCOUNTER — Ambulatory Visit: Payer: Medicare HMO | Admitting: Physician Assistant

## 2022-11-29 ENCOUNTER — Ambulatory Visit (INDEPENDENT_AMBULATORY_CARE_PROVIDER_SITE_OTHER): Payer: Medicare HMO | Admitting: Internal Medicine

## 2022-11-29 ENCOUNTER — Encounter: Payer: Self-pay | Admitting: Physician Assistant

## 2022-11-29 VITALS — BP 130/70 | HR 58 | Temp 98.3°F | Ht 75.0 in | Wt 211.2 lb

## 2022-11-29 VITALS — BP 121/74 | HR 54 | Temp 98.6°F | Resp 18 | Ht 75.0 in | Wt 208.0 lb

## 2022-11-29 DIAGNOSIS — S3600XA Unspecified injury of spleen, initial encounter: Secondary | ICD-10-CM | POA: Diagnosis not present

## 2022-11-29 DIAGNOSIS — S298XXA Other specified injuries of thorax, initial encounter: Secondary | ICD-10-CM

## 2022-11-29 DIAGNOSIS — I872 Venous insufficiency (chronic) (peripheral): Secondary | ICD-10-CM | POA: Diagnosis not present

## 2022-11-29 DIAGNOSIS — S298XXD Other specified injuries of thorax, subsequent encounter: Secondary | ICD-10-CM

## 2022-11-29 DIAGNOSIS — I2510 Atherosclerotic heart disease of native coronary artery without angina pectoris: Secondary | ICD-10-CM
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
## 2022-11-30 ENCOUNTER — Ambulatory Visit (HOSPITAL_COMMUNITY)
Admission: RE | Admit: 2022-11-30 | Discharge: 2022-11-30 | Disposition: A | Discharge status: Home / Self Care | Payer: Medicare HMO | Source: Ambulatory Visit | Attending: Internal Medicine | Admitting: Internal Medicine

## 2022-11-30 ENCOUNTER — Encounter: Payer: Self-pay | Admitting: Internal Medicine

## 2022-11-30 DIAGNOSIS — S298XXD Other specified injuries of thorax, subsequent encounter: Secondary | ICD-10-CM

## 2022-11-30 DIAGNOSIS — I2510 Atherosclerotic heart disease of native coronary artery without angina pectoris: Secondary | ICD-10-CM | POA: Insufficient documentation

## 2022-11-30 DIAGNOSIS — S299XXA Unspecified injury of thorax, initial encounter: Secondary | ICD-10-CM | POA: Diagnosis not present

## 2022-11-30 DIAGNOSIS — S3600XA Unspecified injury of spleen, initial encounter: Secondary | ICD-10-CM

## 2022-11-30 DIAGNOSIS — R918 Other nonspecific abnormal finding of lung field: Secondary | ICD-10-CM | POA: Diagnosis not present
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
## 2022-11-30 NOTE — Addendum Note (Signed)
Addended by: Lula Olszewski on: 11/30/2022 01:26 PM   Modules accepted: Orders

## 2022-12-04 ENCOUNTER — Other Ambulatory Visit: Payer: Medicare HMO

## 2022-12-05 ENCOUNTER — Ambulatory Visit (INDEPENDENT_AMBULATORY_CARE_PROVIDER_SITE_OTHER): Payer: Medicare HMO | Admitting: Family Medicine

## 2022-12-05 ENCOUNTER — Encounter: Payer: No Typology Code available for payment source | Admitting: Family Medicine

## 2022-12-05 ENCOUNTER — Encounter: Payer: Self-pay | Admitting: Family Medicine

## 2022-12-05 VITALS — BP 130/78 | HR 54 | Temp 96.6°F | Ht 75.0 in | Wt 204.2 lb

## 2022-12-05 DIAGNOSIS — R0789 Other chest pain: Secondary | ICD-10-CM

## 2022-12-05 DIAGNOSIS — S51012A Laceration without foreign body of left elbow, initial encounter: Secondary | ICD-10-CM | POA: Diagnosis not present

## 2022-12-05 DIAGNOSIS — I2510 Atherosclerotic heart disease of native coronary artery without angina pectoris: Secondary | ICD-10-CM

## 2022-12-05 NOTE — Patient Instructions (Signed)
It was very nice to see you today!  Will check a cardiac CT scan.  It is okay for you to resume your normal activity levels.  Your pain should continue to improve over the next several weeks.  No follow-ups on file.   Take care, Dr [name]  PLEASE NOTE:  If you had any lab tests, please let us know if you have not heard back within a few days. You may see your results on mychart before we have a chance to review them but we will give you a call once they are reviewed by [place].   If we ordered any referrals today, please let us know if you have not heard from their office within the next week.   If you had any urgent prescriptions sent in today, please check with the pharmacy within an hour of our visit to make sure the prescription was transmitted appropriately.   Please try these tips to maintain a healthy lifestyle:  Eat at least 3 REAL meals and 1-2 snacks per day.  Aim for no more than 5 hours between eating.  If you eat breakfast, please do so within one hour of getting up.   Each meal should contain half fruits/vegetables, one quarter protein, and one quarter carbs (no bigger than a computer mouse)  Cut down on sweet beverages. This includes juice, soda, and sweet tea.   Drink at least 1 glass of water with each meal and aim for at least 8 glasses per day  Exercise at least 150 minutes every week.
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
## 2022-12-14 ENCOUNTER — Ambulatory Visit (HOSPITAL_BASED_OUTPATIENT_CLINIC_OR_DEPARTMENT_OTHER)
Admission: RE | Admit: 2022-12-14 | Discharge: 2022-12-14 | Disposition: A | Discharge status: Home / Self Care | Payer: Medicare HMO | Source: Ambulatory Visit | Attending: Family Medicine | Admitting: Family Medicine

## 2022-12-14 DIAGNOSIS — I2510 Atherosclerotic heart disease of native coronary artery without angina pectoris: Secondary | ICD-10-CM

## 2022-12-18 NOTE — Progress Notes (Signed)
He has a mild amount of calcification in in a couple of the arteries in his heart.  This is better than average for his age.  It may make sense to start a cholesterol medication.  He can schedule an appointment to discuss if he wishes or we can refer him to see cardiologist.

## 2023-01-01 ENCOUNTER — Telehealth: Payer: Self-pay | Admitting: Family Medicine

## 2023-01-01 NOTE — Telephone Encounter (Signed)
Patient is calling to let PCP know he would like to move forward with being referred to a cardiologist following his CT Cardiac score test results.

## 2023-01-02 ENCOUNTER — Other Ambulatory Visit: Payer: Self-pay | Admitting: *Deleted

## 2023-01-02 DIAGNOSIS — I2510 Atherosclerotic heart disease of native coronary artery without angina pectoris: Secondary | ICD-10-CM

## 2023-01-02 NOTE — Telephone Encounter (Signed)
Referral placed.

## 2023-01-15 DIAGNOSIS — E0590 Thyrotoxicosis, unspecified without thyrotoxic crisis or storm: Secondary | ICD-10-CM | POA: Diagnosis not present
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
## 2023-01-18 ENCOUNTER — Ambulatory Visit: Payer: Medicare HMO | Attending: Cardiovascular Disease | Admitting: Cardiovascular Disease

## 2023-01-18 ENCOUNTER — Encounter: Payer: Self-pay | Admitting: Cardiovascular Disease

## 2023-01-18 VITALS — BP 102/64 | HR 61 | Ht 75.0 in | Wt 209.8 lb

## 2023-01-18 DIAGNOSIS — I872 Venous insufficiency (chronic) (peripheral): Secondary | ICD-10-CM

## 2023-01-18 DIAGNOSIS — Z79899 Other long term (current) drug therapy: Secondary | ICD-10-CM

## 2023-01-18 DIAGNOSIS — E782 Mixed hyperlipidemia: Secondary | ICD-10-CM

## 2023-01-18 MED ORDER — NEXLETOL 180 MG PO TABS
180.0000 mg | ORAL_TABLET | Freq: Every day | ORAL | 3 refills | Status: DC
Start: 2023-01-18 — End: 2023-04-29

## 2023-01-18 NOTE — Patient Instructions (Signed)
Medication Instructions:  START Nexletol/Bempedoic Acid 180mg  daily *If you need a refill on your cardiac medications before your next appointment, please call your pharmacy*   Lab Work: Lipids, ALT, BMET in 3 months If you have labs (blood work) drawn today and your tests are completely normal, you will receive your results only by: MyChart Message (if you have MyChart) OR A paper copy in the mail If you have any lab test that is abnormal or we need to change your treatment, we will call you to review the results.   Testing/Procedures: NONE  Follow-Up: At [facility], you and your health needs are our priority.  As part of our continuing mission to provide you with exceptional heart care, we have created designated Provider Care Teams.  These Care Teams include your primary Cardiologist (physician) and Advanced Practice Providers (APPs -  Physician Assistants and Nurse Practitioners) who all work together to provide you with the care you need, when you need it.   Your next appointment:   3 month(s)  Provider:   Kristeen Miss, MD

## 2023-04-22 ENCOUNTER — Other Ambulatory Visit: Payer: Medicare HMO

## 2023-04-22 DIAGNOSIS — E782 Mixed hyperlipidemia: Secondary | ICD-10-CM

## 2023-04-22 DIAGNOSIS — Z79899 Other long term (current) drug therapy: Secondary | ICD-10-CM

## 2023-04-22 DIAGNOSIS — I872 Venous insufficiency (chronic) (peripheral): Secondary | ICD-10-CM

## 2023-04-22 LAB — BASIC METABOLIC PANEL
BUN/Creatinine Ratio: 15 (ref 10–24)
BUN: 19 mg/dL (ref 8–27)
CO2: 25 mmol/L (ref 20–29)
Calcium: 9.8 mg/dL (ref 8.6–10.2)
Chloride: 104 mmol/L (ref 96–106)
Creatinine, Ser: 1.26 mg/dL (ref 0.76–1.27)
Glucose: 92 mg/dL (ref 70–99)
Potassium: 5.2 mmol/L (ref 3.5–5.2)
Sodium: 142 mmol/L (ref 134–144)
eGFR: 61 mL/min/{1.73_m2} (ref >59)

## 2023-04-22 LAB — LIPID PANEL
Chol/HDL Ratio: 2.2 {ratio} (ref 0.0–5.0)
Cholesterol, Total: 147 mg/dL (ref 100–199)
HDL: 66 mg/dL (ref >39)
LDL Chol Calc (NIH): 68 mg/dL (ref 0–99)
Triglycerides: 64 mg/dL (ref 0–149)
VLDL Cholesterol Cal: 13 mg/dL (ref 5–40)

## 2023-04-22 LAB — ALT: ALT: 16 [IU]/L (ref 0–44)

## 2023-04-24 ENCOUNTER — Other Ambulatory Visit: Payer: Self-pay | Admitting: Family Medicine

## 2023-04-24 ENCOUNTER — Ambulatory Visit: Payer: Medicare Other | Admitting: Family Medicine

## 2023-04-24 ENCOUNTER — Encounter: Payer: Self-pay | Admitting: Family Medicine

## 2023-04-24 VITALS — BP 124/73 | HR 59 | Temp 97.2°F | Ht 75.0 in | Wt 207.0 lb

## 2023-04-24 DIAGNOSIS — L219 Seborrheic dermatitis, unspecified: Secondary | ICD-10-CM | POA: Diagnosis not present

## 2023-04-24 DIAGNOSIS — R059 Cough, unspecified: Secondary | ICD-10-CM

## 2023-04-24 DIAGNOSIS — E0590 Thyrotoxicosis, unspecified without thyrotoxic crisis or storm: Secondary | ICD-10-CM | POA: Diagnosis not present

## 2023-04-24 MED ORDER — AZITHROMYCIN 250 MG PO TABS: ORAL_TABLET | ORAL | 0 refills | Status: DC

## 2023-04-24 MED ORDER — KETOCONAZOLE 2 % EX GEL: 1.0000 | Freq: Two times a day (BID) | CUTANEOUS | 5 refills | Status: DC | PRN

## 2023-04-24 MED ORDER — HYDROCORTISONE 1 % EX SOLN: 4.0000 [drp] | Freq: Two times a day (BID) | CUTANEOUS | 0 refills | Status: DC | PRN

## 2023-04-24 NOTE — Patient Instructions (Addendum)
 It was very nice to see you today!  Please try the ketoconzole gel incteased of the cream. Use this twice daily for the next 1-2 days.   You can use the hydrocortisone  as needed for itching.  You can try using an anti dandruff shampoo a few times per week to prevent recurrences.   Return if symptoms worsen or fail to improve.   Take care, Dr Kennyth  PLEASE NOTE:  If you had any lab tests, please let us  know if you have not heard back within a few days. You may see your results on mychart before we have a chance to review them but we will give you a call once they are reviewed by us .   If we ordered any referrals today, please let us  know if you have not heard from their office within the next week.   If you had any urgent prescriptions sent in today, please check with the pharmacy within an hour of our visit to make sure the prescription was transmitted appropriately.   Please try these tips to maintain a healthy lifestyle:  Eat at least 3 REAL meals and 1-2 snacks per day.  Aim for no more than 5 hours between eating.  If you eat breakfast, please do so within one hour of getting up.   Each meal should contain half fruits/vegetables, one quarter protein, and one quarter carbs (no bigger than a computer mouse)  Cut down on sweet beverages. This includes juice, soda, and sweet tea.   Drink at least 1 glass of water with each meal and aim for at least 8 glasses per day  Exercise at least 150 minutes every week.

## 2023-04-24 NOTE — Assessment & Plan Note (Signed)
 Symptoms are not well-controlled.  He is having difficulty with getting ketoconazole  into the crevices in his ear.  We will switch to ketoconazole  gel.  Recommend he do this twice daily for the next 1 to 2 weeks.  Will also send in hydrocortisone  gel.  He can use this as needed for itching though ideally should not need to use this frequently.  Also recommended over-the-counter antidandruff shampoo such as head and shoulders or Nizoral .  He will let us  know if not improving in the next 1 to 2 weeks

## 2023-04-24 NOTE — Assessment & Plan Note (Signed)
 Following with endocrinology.  No longer on methimazole.

## 2023-04-24 NOTE — Telephone Encounter (Signed)
 Pharmacy comment: Alternative Requested:NOT COVERED.Marland KitchenCREAM IS COVERED. Please advise

## 2023-04-24 NOTE — Progress Notes (Signed)
   Wesley Black is a 73 y.o. male who presents today for an office visit.  Assessment/Plan:  New/Acute Problems: Cough  No red flags.  Reassuring lung exam today.  Likely viral URI though may be developing sinusitis.  Will send in pocket prescription for azithromycin  with instruction certainly symptoms fail to improve over the next few days.  Encouraged hydration.  He can use over-the-counter meds as needed.  We discussed reasons return to care.  Chronic Problems Addressed Today: Seborrheic dermatitis Symptoms are not well-controlled.  He is having difficulty with getting ketoconazole  into the crevices in his ear.  We will switch to ketoconazole  gel.  Recommend he do this twice daily for the next 1 to 2 weeks.  Will also send in hydrocortisone  gel.  He can use this as needed for itching though ideally should not need to use this frequently.  Also recommended over-the-counter antidandruff shampoo such as head and shoulders or Nizoral .  He will let us  know if not improving in the next 1 to 2 weeks  Hyperthyroidism Following with endocrinology.  No longer on methimazole .     Subjective:  HPI:  See Assessment / plan for status of chronic conditions.  Patient here with cough for the last 10 days. Cough is productive of brown mucus. No fevers or chills. No shortness of breath. Symptoms are staying about the same.  No chest pain.  He still has ongoing issues with itching and flaking in his years.  He has been using ketoconazole  cream with some improvement.       Objective:  Physical Exam: BP 124/73   Pulse (!) 59   Temp (!) 97.2 F (36.2 C) (Temporal)   Ht 6' 3 (1.905 m)   Wt 207 lb (93.9 kg)   SpO2 100%   BMI 25.87 kg/m   Gen: No acute distress, resting comfortably HEENT: Bilateral auricles with seborrheic dermatitis.  Bilateral EAC with flaking and inflammation as well. CV: Regular rate and rhythm with no murmurs appreciated Pulm: Normal work of breathing, clear to auscultation  bilaterally with no crackles, wheezes, or rhonchi Neuro: Grossly normal, moves all extremities Psych: Normal affect and thought content      [name], MD 04/24/2023 10:37 AM

## 2023-04-25 ENCOUNTER — Other Ambulatory Visit: Payer: Self-pay | Admitting: *Deleted

## 2023-04-25 MED ORDER — KETOCONAZOLE 2 % EX SHAM: 1.0000 | MEDICATED_SHAMPOO | CUTANEOUS | 0 refills | Status: DC

## 2023-04-26 DIAGNOSIS — E0590 Thyrotoxicosis, unspecified without thyrotoxic crisis or storm: Secondary | ICD-10-CM | POA: Diagnosis not present

## 2023-04-26 DIAGNOSIS — I2510 Atherosclerotic heart disease of native coronary artery without angina pectoris: Secondary | ICD-10-CM | POA: Diagnosis not present

## 2023-04-26 DIAGNOSIS — N1831 Chronic kidney disease, stage 3a: Secondary | ICD-10-CM | POA: Diagnosis not present

## 2023-04-26 DIAGNOSIS — R911 Solitary pulmonary nodule: Secondary | ICD-10-CM | POA: Diagnosis not present

## 2023-04-26 DIAGNOSIS — Z86718 Personal history of other venous thrombosis and embolism: Secondary | ICD-10-CM | POA: Diagnosis not present

## 2023-04-27 ENCOUNTER — Encounter: Payer: Self-pay | Admitting: Cardiovascular Disease
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
## 2023-04-29 ENCOUNTER — Ambulatory Visit: Payer: Medicare Other | Attending: Cardiovascular Disease | Admitting: Cardiovascular Disease

## 2023-04-29 VITALS — BP 120/76 | HR 60 | Ht 75.0 in | Wt 202.0 lb

## 2023-04-29 DIAGNOSIS — E782 Mixed hyperlipidemia: Secondary | ICD-10-CM

## 2023-04-29 NOTE — Patient Instructions (Signed)
 Medication Instructions:  Your physician has recommended you make the following change in your medication:   1) STOP bempedoic acid  (Nexletol )  *If you need a refill on your cardiac medications before your next appointment, please call your pharmacy*  Lab Work: In 3-4 months (around July 29, 2023) at any [facility] location: Lipid panel, ALT, BMP, LP(a) If you have labs (blood work) drawn today and your tests are completely normal, you will receive your results only by: MyChart Message (if you have MyChart) OR A paper copy in the mail If you have any lab test that is abnormal or we need to change your treatment, we will call you to review the results.  Testing/Procedures: None ordered today.  Follow-Up: At [facility], you and your health needs are our priority.  As part of our continuing mission to provide you with exceptional heart care, we have created designated Provider Care Teams.  These Care Teams include your primary Cardiologist (physician) and Advanced Practice Providers (APPs -  Physician Assistants and Nurse Practitioners) who all work together to provide you with the care you need, when you need it.  Your next appointment:   4 month(s)  The format for your next appointment:   In Person  Provider:   Aleene Passe, MD {  Other Instructions Gerlene Passe email for cycling  [email or web address]

## 2023-05-28 DIAGNOSIS — H40013 Open angle with borderline findings, low risk, bilateral: Secondary | ICD-10-CM | POA: Diagnosis not present

## 2023-07-03 DIAGNOSIS — E0590 Thyrotoxicosis, unspecified without thyrotoxic crisis or storm: Secondary | ICD-10-CM | POA: Diagnosis not present

## 2023-08-25 ENCOUNTER — Encounter: Payer: Self-pay | Admitting: Cardiovascular Disease
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
## 2023-08-26 ENCOUNTER — Encounter: Payer: Self-pay | Admitting: Cardiovascular Disease

## 2023-08-26 ENCOUNTER — Ambulatory Visit: Attending: Cardiovascular Disease | Admitting: Cardiovascular Disease

## 2023-08-26 VITALS — BP 96/56 | HR 66 | Ht 75.0 in | Wt 202.0 lb

## 2023-08-26 DIAGNOSIS — Z79899 Other long term (current) drug therapy: Secondary | ICD-10-CM

## 2023-08-26 DIAGNOSIS — I2510 Atherosclerotic heart disease of native coronary artery without angina pectoris: Secondary | ICD-10-CM

## 2023-08-26 DIAGNOSIS — E782 Mixed hyperlipidemia: Secondary | ICD-10-CM | POA: Diagnosis not present

## 2023-08-26 NOTE — Patient Instructions (Signed)
  Lab Work: Lipids, ALT, BMET today If you have labs (blood work) drawn today and your tests are completely normal, you will receive your results only by: MyChart Message (if you have MyChart) OR A paper copy in the mail If you have any lab test that is abnormal or we need to change your treatment, we will call you to review the results.  Follow-Up: At [facility], you and your health needs are our priority.  As part of our continuing mission to provide you with exceptional heart care, our providers are all part of one team.  This team includes your primary Cardiologist (physician) and Advanced Practice Providers or APPs (Physician Assistants and Nurse Practitioners) who all work together to provide you with the care you need, when you need it.  Your next appointment:   1 year(s)  Provider:   Ahmad Alert, MD

## 2023-08-27 LAB — BASIC METABOLIC PANEL WITH GFR
BUN/Creatinine Ratio: 18 (ref 10–24)
BUN: 22 mg/dL (ref 8–27)
CO2: 24 mmol/L (ref 20–29)
Calcium: 9.2 mg/dL (ref 8.6–10.2)
Chloride: 104 mmol/L (ref 96–106)
Creatinine, Ser: 1.19 mg/dL (ref 0.76–1.27)
Glucose: 96 mg/dL (ref 70–99)
Potassium: 5.2 mmol/L (ref 3.5–5.2)
Sodium: 141 mmol/L (ref 134–144)
eGFR: 66 mL/min/{1.73_m2} (ref >59)

## 2023-08-27 LAB — LIPID PANEL
Chol/HDL Ratio: 2 ratio (ref 0.0–5.0)
Cholesterol, Total: 138 mg/dL (ref 100–199)
HDL: 69 mg/dL (ref >39)
LDL Chol Calc (NIH): 55 mg/dL (ref 0–99)
Triglycerides: 68 mg/dL (ref 0–149)
VLDL Cholesterol Cal: 14 mg/dL (ref 5–40)

## 2023-08-27 LAB — ALT: ALT: 17 IU/L (ref 0–44)

## 2023-08-28 ENCOUNTER — Ambulatory Visit: Payer: Self-pay | Admitting: Cardiovascular Disease

## 2023-08-30 ENCOUNTER — Ambulatory Visit: Payer: Medicare Other | Admitting: Cardiovascular Disease

## 2023-08-30 ENCOUNTER — Encounter: Payer: Self-pay | Admitting: Family Medicine

## 2023-08-30 ENCOUNTER — Ambulatory Visit (INDEPENDENT_AMBULATORY_CARE_PROVIDER_SITE_OTHER): Admitting: Family Medicine

## 2023-08-30 VITALS — BP 117/64 | HR 62 | Temp 97.5°F | Ht 75.0 in | Wt 198.0 lb

## 2023-08-30 DIAGNOSIS — H6990 Unspecified Eustachian tube disorder, unspecified ear: Secondary | ICD-10-CM

## 2023-08-30 DIAGNOSIS — I872 Venous insufficiency (chronic) (peripheral): Secondary | ICD-10-CM | POA: Diagnosis not present

## 2023-08-30 DIAGNOSIS — L219 Seborrheic dermatitis, unspecified: Secondary | ICD-10-CM | POA: Diagnosis not present

## 2023-08-30 MED ORDER — HYDROCORTISONE-ACETIC ACID 1-2 % OT SOLN: 3.0000 [drp] | Freq: Two times a day (BID) | OTIC | 0 refills | Status: DC

## 2023-08-30 MED ORDER — AZELASTINE HCL 0.1 % NA SOLN: 2.0000 | Freq: Two times a day (BID) | NASAL | 12 refills | Status: DC

## 2023-08-30 NOTE — Progress Notes (Signed)
   Wesley Black is a 73 y.o. male who presents today for an office visit.  Assessment/Plan:  New/Acute Problems: Ear Fullness EAC successfully irrigated by RMA today though still having persistent symptoms. Likely has eustachian tube dysfunction. Overall reassuring exam. We discussed treatment options including medications vs referral to ENT.  We will start Astelin .  He can use over-the-counter Flonase as well.  He will follow-up with me in a week or so.  If not improving we can refer to ENT.  We discussed reasons to return to care or seek emergent care.  Chronic Problems Addressed Today: Chronic venous insufficiency Had lengthy discussion with patient today regarding his venous insufficiency.  This has been a longstanding issue but has improved intermittently with conservative measures.  Recently went on a trip to [place] and did reasonably well with compression hose.  He is also working on salt avoidance and keeping his legs elevated though still has persistent swelling in his foot.  We did discuss potentially adding on a diuretic however he would like to hold off on this for now.  He has seen vascular surgery recently as well and they have recommended continue with conservative measures.  He will let us  know if symptoms become worse or unmanageable and would consider starting diuretic at that point.  Seborrheic dermatitis He is having difficulty with using ketoconazole .  Will start acetic acid hydrocortisone  eardrops as needed.  As above we did discuss referral to ENT however he would like to hold off on this for now.  He will follow-up with me in 1 to 2 weeks and we can refer at that time if needed.     Subjective:  HPI:  See assessment / plan for status of chronic conditions.  His main concern today is ear fullness in his right ear. This started a few days ago. Had an issue where he got water in his ear and then had difficulty removing it. He was concerned about ear wax blockage. He is  sometimes getting a popping sensation in his ear. He has had some mild decreased hearing. No pain. No fevers or chills.        Objective:  Physical Exam: BP 117/64   Pulse 62   Temp (!) 97.5 F (36.4 C) (Temporal)   Ht 6\' 3"  (1.905 m)   Wt 198 lb (89.8 kg)   SpO2 97%   BMI 24.75 kg/m   Gen: No acute distress, resting comfortably HEENT: Bilateral external ears with erythematous flaky rash noted.  Small amount of irritation in bilateral EAC as well.  TMs clear bilaterally without effusion. CV: Regular rate and rhythm with no murmurs appreciated Pulm: Normal work of breathing, clear to auscultation bilaterally with no crackles, wheezes, or rhonchi Neuro: Grossly normal, moves all extremities Psych: Normal affect and thought content   Time Spent: 45 minutes of total time was spent on the date of the encounter performing the following actions: chart review prior to seeing the patient, obtaining history, performing a medically necessary exam, counseling on the treatment plan, placing orders, and documenting in our EHR.       Jinny Mounts. Daneil Dunker, MD 08/30/2023 1:04 PM

## 2023-08-30 NOTE — Patient Instructions (Addendum)
 It was very nice to see you today!  I think you have eustachian tube dysfunction.   Please start Flonase. I will also send in a prescription for astelin .   Please start the eardrops.  Please continue keeping your legs elevated and using your compression stockings for your leg swelling.  Let us  know if this becomes more of an issue.  Return if symptoms worsen or fail to improve.   Take care, Dr Daneil Dunker  PLEASE NOTE:  If you had any lab tests, please let us  know if you have not heard back within a few days. You may see your results on mychart before we have a chance to review them but we will give you a call once they are reviewed by us .   If we ordered any referrals today, please let us  know if you have not heard from their office within the next week.   If you had any urgent prescriptions sent in today, please check with the pharmacy within an hour of our visit to make sure the prescription was transmitted appropriately.   Please try these tips to maintain a healthy lifestyle:  Eat at least 3 REAL meals and 1-2 snacks per day.  Aim for no more than 5 hours between eating.  If you eat breakfast, please do so within one hour of getting up.   Each meal should contain half fruits/vegetables, one quarter protein, and one quarter carbs (no bigger than a computer mouse)  Cut down on sweet beverages. This includes juice, soda, and sweet tea.   Drink at least 1 glass of water with each meal and aim for at least 8 glasses per day  Exercise at least 150 minutes every week.

## 2023-08-30 NOTE — Assessment & Plan Note (Signed)
 Had lengthy discussion with patient today regarding his venous insufficiency.  This has been a longstanding issue but has improved intermittently with conservative measures.  Recently went on a trip to [place] and did reasonably well with compression hose.  He is also working on salt avoidance and keeping his legs elevated though still has persistent swelling in his foot.  We did discuss potentially adding on a diuretic however he would like to hold off on this for now.  He has seen vascular surgery recently as well and they have recommended continue with conservative measures.  He will let us  know if symptoms become worse or unmanageable and would consider starting diuretic at that point.

## 2023-08-30 NOTE — Assessment & Plan Note (Signed)
 He is having difficulty with using ketoconazole .  Will start acetic acid hydrocortisone  eardrops as needed.  As above we did discuss referral to ENT however he would like to hold off on this for now.  He will follow-up with me in 1 to 2 weeks and we can refer at that time if needed.

## 2023-09-05 DIAGNOSIS — Z86718 Personal history of other venous thrombosis and embolism: Secondary | ICD-10-CM | POA: Diagnosis not present

## 2023-09-05 DIAGNOSIS — I2510 Atherosclerotic heart disease of native coronary artery without angina pectoris: Secondary | ICD-10-CM | POA: Diagnosis not present

## 2023-09-05 DIAGNOSIS — R911 Solitary pulmonary nodule: Secondary | ICD-10-CM | POA: Diagnosis not present

## 2023-09-05 DIAGNOSIS — E0590 Thyrotoxicosis, unspecified without thyrotoxic crisis or storm: Secondary | ICD-10-CM | POA: Diagnosis not present

## 2023-11-12 ENCOUNTER — Ambulatory Visit: Payer: Medicare HMO

## 2023-12-26 ENCOUNTER — Ambulatory Visit (INDEPENDENT_AMBULATORY_CARE_PROVIDER_SITE_OTHER)

## 2023-12-26 VITALS — BP 124/68 | HR 83 | Temp 98.1°F | Ht 75.0 in | Wt 192.0 lb

## 2023-12-26 DIAGNOSIS — Z0000 Encounter for general adult medical examination without abnormal findings: Secondary | ICD-10-CM

## 2023-12-26 NOTE — Progress Notes (Signed)
 Subjective:   Wesley Black is a 73 y.o. who presents for a Medicare Wellness preventive visit.  As a reminder, Annual Wellness Visits don't include a physical exam, and some assessments may be limited, especially if this visit is performed virtually. We may recommend an in-person follow-up visit with your provider if needed.  Visit Complete: In person    Persons Participating in Visit: Patient.  AWV Questionnaire: Yes: Patient Medicare AWV questionnaire was completed by the patient on 12/22/23; I have confirmed that all information answered by patient is correct and no changes since this date.  Cardiac Risk Factors include: advanced age (>1men, >81 women);male gender     Objective:    Today's Vitals   12/26/23 1424  BP: 124/68  Pulse: 83  Temp: 98.1 F (36.7 C)  SpO2: 96%  Weight: 192 lb (87.1 kg)  Height: 6' 3 (1.905 m)   Body mass index is 24 kg/m.     12/26/2023    2:31 PM 11/05/2022    9:53 AM 04/27/2021    8:06 AM 04/21/2020    1:04 PM 03/06/2019    1:54 PM  Advanced Directives  Does Patient Have a Medical Advance Directive? Yes Yes Yes Yes Yes  Type of Estate agent of [place];Living will Healthcare Power of [place];Living will Living will Healthcare Power of [place];Living will   Copy of Healthcare Power of Attorney in Chart? No - copy requested No - copy requested  No - copy requested     Current Medications (verified) Outpatient Encounter Medications as of 12/26/2023  Medication Sig   aspirin EC 81 MG tablet Take 81 mg by mouth daily. Swallow whole.   tolterodine (DETROL LA) 4 MG 24 hr capsule Take 4 mg by mouth daily.   [DISCONTINUED] acetic acid -hydrocortisone  (VOSOL -HC) OTIC solution Place 3 drops into both ears 2 (two) times daily.   [DISCONTINUED] azelastine  (ASTELIN ) 0.1 % nasal spray Place 2 sprays into both nostrils 2 (two) times daily.   [DISCONTINUED] b complex vitamins tablet Take 1 tablet by mouth daily.   No  facility-administered encounter medications on file as of 12/26/2023.    Allergies (verified) Patient has no known allergies.   History: Past Medical History:  Diagnosis Date   DVT (deep venous thrombosis) (HCC)    Fracture    Multiple   Past Surgical History:  Procedure Laterality Date   COLONOSCOPY     ENDOVENOUS ABLATION SAPHENOUS VEIN W/ LASER Right 07/28/2020   endovenous laser ablation right greater saphenous vein and stab phlebectomy < 10 incisions right leg by Medford Blade MD    KNEE ARTHROPLASTY Right 2004   LASIK Bilateral 2001   SHOULDER SURGERY Left 1984   Family History  Problem Relation Age of Onset   Hypertension Mother    Arthritis Mother    Hyperlipidemia Mother    Prostate cancer Father    Cancer Father    Hyperlipidemia Father    Hypertension Father    Prostate cancer Brother    Obesity Brother    Hyperthyroidism Brother    Asthma Maternal Grandmother    Hyperlipidemia Maternal Grandfather    Hypertension Maternal Grandfather    Stroke Maternal Grandfather    COPD Paternal Grandfather    Colon cancer Neg Hx    Esophageal cancer Neg Hx    Rectal cancer Neg Hx    Stomach cancer Neg Hx    Social History   Socioeconomic History   Marital status: Single    Spouse name:  Not on file   Number of children: 0   Years of education: Master's   Highest education level: Master's degree (e.g., MA, MS, MEng, MEd, MSW, MBA)  Occupational History   Occupation: Retired  Tobacco Use   Smoking status: Never   Smokeless tobacco: Never  Vaping Use   Vaping status: Never Used  Substance and Sexual Activity   Alcohol use: Yes    Comment: 2 glasses of wine or beer per month   Drug use: No   Sexual activity: Not on file    Comment: Married  Other Topics Concern   Not on file  Social History Narrative   Lives at home, separated   Left-handed   Caffeine: 1 glass of tea per day, occasional 7 oz Pepsi (every 2 wks)      Social Drivers of Health    Financial Resource Strain: Low Risk  (12/26/2023)   Overall Financial Resource Strain (CARDIA)    Difficulty of Paying Living Expenses: Not hard at all  Food Insecurity: No Food Insecurity (12/26/2023)   Hunger Vital Sign    Worried About Running Out of Food in the Last Year: Never true    Ran Out of Food in the Last Year: Never true  Transportation Needs: No Transportation Needs (12/26/2023)   PRAPARE - Administrator, Civil Service (Medical): No    Lack of Transportation (Non-Medical): No  Physical Activity: Sufficiently Active (12/26/2023)   Exercise Vital Sign    Days of Exercise per Week: 4 days    Minutes of Exercise per Session: 90 min  Stress: No Stress Concern Present (12/26/2023)   [employer] of Occupational Health - Occupational Stress Questionnaire    Feeling of Stress: Not at all  Social Connections: Moderately Integrated (12/26/2023)   Social Connection and Isolation Panel    Frequency of Communication with Friends and Family: More than three times a week    Frequency of Social Gatherings with Friends and Family: More than three times a week    Attends Religious Services: More than 4 times per year    Active Member of [employer] or Organizations: Yes    Attends Banker Meetings: 1 to 4 times per year    Marital Status: Never married    Tobacco Counseling Counseling given: Not Answered    Clinical Intake:  Pre-visit preparation completed: Yes  Pain : No/denies pain     BMI - recorded: 24 Nutritional Status: BMI of 19-24  Normal Nutritional Risks: None Diabetes: No  Lab Results  Component Value Date   HGBA1C 5.3 11/05/2022   HGBA1C 5.3 03/29/2022   HGBA1C 5.5 11/29/2021     How often do you need to have someone help you when you read instructions, pamphlets, or other written materials from your doctor or pharmacy?: 1 - Never  Interpreter Needed?: No  Information entered by :: Ellouise Haws, LPN   Activities of Daily  Living     12/22/2023    5:23 PM 11/08/2023   11:07 AM  In your present state of health, do you have any difficulty performing the following activities:  Hearing? 0 0  Vision? 0 0  Difficulty concentrating or making decisions? 0 0  Walking or climbing stairs? 0 0  Dressing or bathing? 0 0  Doing errands, shopping? 0 0  Preparing Food and eating ? N N  Using the Toilet? N N  In the past six months, have you accidently leaked urine? N N  Do you  have problems with loss of bowel control? N N  Managing your Medications? N N  Managing your Finances? N N  Housekeeping or managing your Housekeeping? N N    Patient Care Team: Kennyth Worth HERO, MD as PCP - General (Family Medicine) Nahser, Aleene PARAS, MD (Inactive) as PCP - Cardiology (Cardiology) Alona Lamar SAILOR, MD (Inactive) as Consulting Physician (Family Medicine) Zaldivar, Renzo A, MD as Attending Physician (Ophthalmology) Zaldivar, Renzo A, MD as Attending Physician (Ophthalmology)  I have updated your Care Teams any recent Medical Services you may have received from other providers in the past year.     Assessment:   This is a routine wellness examination for Wesley Black.  Hearing/Vision screen Hearing Screening - Comments:: Pt denies any hearing issues  Vision Screening - Comments:: Wears rx glasses - up to date with routine eye exams with [facility]    Goals Addressed             This Visit's Progress    Patient Stated       Maintain health and activity        Depression Screen     12/26/2023    2:31 PM 08/30/2023   11:52 AM 04/24/2023   10:06 AM 04/24/2023   10:03 AM 12/05/2022   10:16 AM 11/29/2022    3:27 PM 11/05/2022    9:52 AM  PHQ 2/9 Scores  PHQ - 2 Score 0 0 0 0 0 0 0  PHQ- 9 Score   0  0  0    Fall Risk     12/22/2023    5:23 PM 11/08/2023   11:07 AM 08/30/2023   11:52 AM 04/24/2023   10:03 AM 12/05/2022   10:16 AM  Fall Risk   Falls in the past year? 0 0 0 0 0  Number falls in past yr:  0 0 0 0  Injury  with Fall? 0 0 0 0 0  Risk for fall due to : No Fall Risks  No Fall Risks No Fall Risks No Fall Risks  Follow up Falls prevention discussed        MEDICARE RISK AT HOME:  Medicare Risk at Home Any stairs in or around the home?: (Patient-Rptd) Yes If so, are there any without handrails?: (Patient-Rptd) No Home free of loose throw rugs in walkways, pet beds, electrical cords, etc?: (Patient-Rptd) Yes Adequate lighting in your home to reduce risk of falls?: (Patient-Rptd) Yes Life alert?: (Patient-Rptd) No Use of a cane, walker or w/c?: (Patient-Rptd) No Grab bars in the bathroom?: (Patient-Rptd) No Shower chair or bench in shower?: (Patient-Rptd) No Elevated toilet seat or a handicapped toilet?: (Patient-Rptd) No  TIMED UP AND GO:  Was the test performed?  Yes  Length of time to ambulate 10 feet: 10 sec Gait steady and fast without use of assistive device  Cognitive Function: 6CIT completed        12/26/2023    2:33 PM 11/05/2022    9:56 AM 04/27/2021    8:10 AM 04/21/2020    1:10 PM  6CIT Screen  What Year? 0 points 0 points 0 points 0 points  What month? 0 points 0 points 0 points 0 points  What time? 0 points 0 points 0 points   Count back from 20 0 points 0 points 0 points 0 points  Months in reverse 0 points 0 points 0 points 0 points  Repeat phrase 0 points 0 points 0 points 0 points  Total Score 0  points 0 points 0 points     Immunizations Immunization History  Administered Date(s) Administered    sv, Bivalent, Protein Subunit Rsvpref,pf (Abrysvo) 04/03/2022   Fluad Quad(high Dose 65+) 03/06/2019, 02/16/2020, 02/21/2021, 03/29/2022   INFLUENZA, HIGH DOSE SEASONAL PF 04/04/2023   Influenza,inj,Quad PF,6+ Mos 12/12/2017   Influenza-Unspecified 01/13/2022   Moderna Covid-19 Fall Seasonal Vaccine 59yrs & older 01/13/2022, 04/04/2023   Moderna Sars-Covid-2 Vaccination 05/12/2019, 06/09/2019   PFIZER(Purple Top)SARS-COV-2 Vaccination 03/04/2020   Pfizer Covid-19 Vaccine  Bivalent Booster 68yrs & up 02/07/2021   Pneumococcal Conjugate-13 12/12/2017   Pneumococcal Polysaccharide-23 03/06/2019   Tdap 11/18/2018    Screening Tests Health Maintenance  Topic Date Due   Zoster Vaccines- Shingrix (1 of 2) Never done   Influenza Vaccine  11/15/2023   COVID-19 Vaccine (7 - Mixed Product risk 2024-25 season) 12/16/2023   Medicare Annual Wellness (AWV)  12/25/2024   DTaP/Tdap/Td (2 - Td or Tdap) 11/17/2028   Colonoscopy  03/23/2029   Pneumococcal Vaccine: 50+ Years  Completed   Hepatitis C Screening  Completed   HPV VACCINES  Aged Out   Meningococcal B Vaccine  Aged Out   Fecal DNA (Cologuard)  Discontinued    Health Maintenance Items Addressed: See Nurse Notes at the end of this note  Additional Screening:  Vision Screening: Recommended annual ophthalmology exams for early detection of glaucoma and other disorders of the eye. Is the patient up to date with their annual eye exam?  Yes  Who is the provider or what is the name of the office in which the patient attends annual eye exams? [facility]   Dental Screening: Recommended annual dental exams for proper oral hygiene  Community Resource Referral / Chronic Care Management: CRR required this visit?  No   CCM required this visit?  No   Plan:    I have personally reviewed and noted the following in the patient's chart:   Medical and social history Use of alcohol, tobacco or illicit drugs  Current medications and supplements including opioid prescriptions. Patient is not currently taking opioid prescriptions. Functional ability and status Nutritional status Physical activity Advanced directives List of other physicians Hospitalizations, surgeries, and ER visits in previous 12 months Vitals Screenings to include cognitive, depression, and falls Referrals and appointments  In addition, I have reviewed and discussed with patient certain preventive protocols, quality metrics, and best  practice recommendations. A written personalized care plan for preventive services as well as general preventive health recommendations were provided to patient.   Ellouise VEAR Haws, LPN   [id number]   After Visit Summary: (MyChart) Due to this being a telephonic visit, the after visit summary with patients personalized plan was offered to patient via MyChart   Notes: Nothing significant to report at this time. Pt stated he will get flu vaccine late oct early November when he comes in to see PCP

## 2023-12-26 NOTE — Patient Instructions (Signed)
 Mr. Fennell,  Thank you for taking the time for your Medicare Wellness Visit. I appreciate your continued commitment to your health goals. Please review the care plan we discussed, and feel free to reach out if I can assist you further.  Medicare recommends these wellness visits once per year to help you and your care team stay ahead of potential health issues. These visits are designed to focus on prevention, allowing your provider to concentrate on managing your acute and chronic conditions during your regular appointments.  Please note that Annual Wellness Visits do not include a physical exam. Some assessments may be limited, especially if the visit was conducted virtually. If needed, we may recommend a separate in-person follow-up with your provider.  Ongoing Care Seeing your primary care provider every 3 to 6 months helps us  monitor your health and provide consistent, personalized care.   Referrals If a referral was made during today's visit and you haven't received any updates within two weeks, please contact the referred provider directly to check on the status.  Recommended Screenings:  Health Maintenance  Topic Date Due   Zoster (Shingles) Vaccine (1 of 2) Never done   Flu Shot  11/15/2023   COVID-19 Vaccine (7 - Mixed Product risk 2024-25 season) 12/16/2023   Medicare Annual Wellness Visit  12/25/2024   DTaP/Tdap/Td vaccine (2 - Td or Tdap) 11/17/2028   Colon Cancer Screening  03/23/2029   Pneumococcal Vaccine for age over 51  Completed   Hepatitis C Screening  Completed   HPV Vaccine  Aged Out   Meningitis B Vaccine  Aged Out   Cologuard (Stool DNA test)  Discontinued       12/26/2023    2:31 PM  Advanced Directives  Does Patient Have a Medical Advance Directive? Yes  Type of Estate agent of [place];Living will  Copy of Healthcare Power of Attorney in Chart? No - copy requested   Advance Care Planning is important because it: Ensures you receive  medical care that aligns with your values, goals, and preferences. Provides guidance to your family and loved ones, reducing the emotional burden of decision-making during critical moments.  Vision: Annual vision screenings are recommended for early detection of glaucoma, cataracts, and diabetic retinopathy. These exams can also reveal signs of chronic conditions such as diabetes and high blood pressure.  Dental: Annual dental screenings help detect early signs of oral cancer, gum disease, and other conditions linked to overall health, including heart disease and diabetes.  Please see the attached documents for additional preventive care recommendations.

## 2024-01-07 ENCOUNTER — Ambulatory Visit: Admitting: Family Medicine

## 2024-01-08 ENCOUNTER — Encounter: Payer: Self-pay | Admitting: Family Medicine

## 2024-01-08 ENCOUNTER — Ambulatory Visit (INDEPENDENT_AMBULATORY_CARE_PROVIDER_SITE_OTHER): Admitting: Family Medicine

## 2024-01-08 VITALS — BP 109/59 | HR 53 | Temp 97.2°F | Ht 75.0 in | Wt 191.8 lb

## 2024-01-08 DIAGNOSIS — H9201 Otalgia, right ear: Secondary | ICD-10-CM

## 2024-01-08 MED ORDER — PREDNISONE 20 MG PO TABS: 20.0000 mg | ORAL_TABLET | Freq: Every day | ORAL | 0 refills | Status: DC

## 2024-01-08 NOTE — Patient Instructions (Signed)
 It was very nice to see you today!  You have eustachian tube dysfunction.  Start the prednisone .  Take Sudafed prior to your flight.  Let us  know if you need any further assistance.  Return if symptoms worsen or fail to improve.   Take care, Dr Kennyth  PLEASE NOTE:  If you had any lab tests, please let us  know if you have not heard back within a few days. You may see your results on mychart before we have a chance to review them but we will give you a call once they are reviewed by us .   If we ordered any referrals today, please let us  know if you have not heard from their office within the next week.   If you had any urgent prescriptions sent in today, please check with the pharmacy within an hour of our visit to make sure the prescription was transmitted appropriately.   Please try these tips to maintain a healthy lifestyle:  Eat at least 3 REAL meals and 1-2 snacks per day.  Aim for no more than 5 hours between eating.  If you eat breakfast, please do so within one hour of getting up.   Each meal should contain half fruits/vegetables, one quarter protein, and one quarter carbs (no bigger than a computer mouse)  Cut down on sweet beverages. This includes juice, soda, and sweet tea.   Drink at least 1 glass of water with each meal and aim for at least 8 glasses per day  Exercise at least 150 minutes every week.

## 2024-01-08 NOTE — Progress Notes (Signed)
   Wesley Black is a 73 y.o. male who presents today for an office visit.  Assessment/Plan:  Otalgia No red flags. No signs of otitis media. Likely has eustachian tube dysfunction based on exam. Due to upcoming flight would be reasonable to treatment this aggressively. He does have a history of facial fracture and has atypical anatomy as well.  We did discuss over-the-counter intranasal steroid such as Flonase.  Will start prednisone  burst 20 mg daily for 5 days.  Also recommended he try Sudafed prior to flight.  He will let us  know if he has any change in symptoms or if symptoms do not improve with above.  We discussed reasons to return to care.     Subjective:  HPI:  See assessment / plan for status of chronic conditions. Patient here with ear pain. Located in right ear. Started about a week ago. Feels like he has some swelling in that ear. He has noticed that using earbuds to the area has made the pain worse. Flying about 4 days from now to [place] and [place] and would like to make sure that he doesn't need to do anything prior to flying.        Objective:  Physical Exam: BP (!) 109/59   Pulse (!) 53   Temp (!) 97.2 F (36.2 C) (Temporal)   Ht 6' 3 (1.905 m)   Wt 191 lb 12.8 oz (87 kg)   SpO2 99%   BMI 23.97 kg/m   Gen: No acute distress, resting comfortably HEENT: Left tympanic membrane clear. Right tympanic membrane with clear effusion.  CV: Regular rate and rhythm with no murmurs appreciated Pulm: Normal work of breathing, clear to auscultation bilaterally with no crackles, wheezes, or rhonchi Neuro: Grossly normal, moves all extremities Psych: Normal affect and thought content      Hilarie Sinha M. Kennyth, MD 01/08/2024 10:09 AM

## 2024-03-30 ENCOUNTER — Ambulatory Visit: Admitting: Family Medicine

## 2024-03-30 ENCOUNTER — Encounter: Payer: Self-pay | Admitting: Family Medicine

## 2024-03-30 VITALS — BP 112/62 | HR 47 | Temp 97.1°F | Ht 75.0 in | Wt 185.0 lb

## 2024-03-30 DIAGNOSIS — Z13220 Encounter for screening for lipoid disorders: Secondary | ICD-10-CM

## 2024-03-30 DIAGNOSIS — Z131 Encounter for screening for diabetes mellitus: Secondary | ICD-10-CM

## 2024-03-30 DIAGNOSIS — F320 Major depressive disorder, single episode, mild: Secondary | ICD-10-CM

## 2024-03-30 DIAGNOSIS — K5900 Constipation, unspecified: Secondary | ICD-10-CM

## 2024-03-30 DIAGNOSIS — E0590 Thyrotoxicosis, unspecified without thyrotoxic crisis or storm: Secondary | ICD-10-CM

## 2024-03-30 DIAGNOSIS — I809 Phlebitis and thrombophlebitis of unspecified site: Secondary | ICD-10-CM | POA: Diagnosis not present

## 2024-03-30 DIAGNOSIS — L219 Seborrheic dermatitis, unspecified: Secondary | ICD-10-CM

## 2024-03-30 DIAGNOSIS — D1801 Hemangioma of skin and subcutaneous tissue: Secondary | ICD-10-CM

## 2024-03-30 DIAGNOSIS — Z125 Encounter for screening for malignant neoplasm of prostate: Secondary | ICD-10-CM

## 2024-03-30 DIAGNOSIS — Z0001 Encounter for general adult medical examination with abnormal findings: Secondary | ICD-10-CM | POA: Diagnosis not present

## 2024-03-30 LAB — LIPID PANEL
Cholesterol: 151 mg/dL (ref 0–200)
HDL: 65.7 mg/dL (ref >39.00)
LDL Cholesterol: 74 mg/dL (ref 0–99)
NonHDL: 84.84
Total CHOL/HDL Ratio: 2
Triglycerides: 52 mg/dL (ref 0.0–149.0)
VLDL: 10.4 mg/dL (ref 0.0–40.0)

## 2024-03-30 LAB — COMPREHENSIVE METABOLIC PANEL WITH GFR
ALT: 10 U/L (ref 0–53)
AST: 17 U/L (ref 0–37)
Albumin: 4.1 g/dL (ref 3.5–5.2)
Alkaline Phosphatase: 59 U/L (ref 39–117)
BUN: 23 mg/dL (ref 6–23)
CO2: 29 meq/L (ref 19–32)
Calcium: 9.3 mg/dL (ref 8.4–10.5)
Chloride: 105 meq/L (ref 96–112)
Creatinine, Ser: 1.01 mg/dL (ref 0.40–1.50)
GFR: 74.93 mL/min (ref >60.00)
Glucose, Bld: 94 mg/dL (ref 70–99)
Potassium: 4.9 meq/L (ref 3.5–5.1)
Sodium: 140 meq/L (ref 135–145)
Total Bilirubin: 0.9 mg/dL (ref 0.2–1.2)
Total Protein: 6.3 g/dL (ref 6.0–8.3)

## 2024-03-30 LAB — HEMOGLOBIN A1C: Hgb A1c MFr Bld: 5.2 % (ref 4.6–6.5)

## 2024-03-30 LAB — CBC
HCT: 41.2 % (ref 39.0–52.0)
Hemoglobin: 14.1 g/dL (ref 13.0–17.0)
MCHC: 34.3 g/dL (ref 30.0–36.0)
MCV: 88.8 fl (ref 78.0–100.0)
Platelets: 122 K/uL — ABNORMAL LOW (ref 150.0–400.0)
RBC: 4.63 Mil/uL (ref 4.22–5.81)
RDW: 13.6 % (ref 11.5–15.5)
WBC: 3.2 K/uL — ABNORMAL LOW (ref 4.0–10.5)

## 2024-03-30 LAB — TSH: TSH: <0.01 u[IU]/mL — ABNORMAL LOW (ref 0.35–5.50)

## 2024-03-30 LAB — PSA: PSA: 1.72 ng/mL (ref 0.10–4.00)

## 2024-03-30 MED ORDER — CLOTRIMAZOLE-BETAMETHASONE 1-0.05 % EX CREA: 1.0000 | TOPICAL_CREAM | Freq: Every day | CUTANEOUS | 0 refills | Status: AC

## 2024-03-30 NOTE — Assessment & Plan Note (Signed)
 Follows with endocrinology.  No longer on methimazole .  Check TSH today.

## 2024-03-30 NOTE — Progress Notes (Signed)
 Chief Complaint:  Wesley Black is a 73 y.o. male who presents today for his annual comprehensive physical exam.    Assessment/Plan:  New/Acute Problems: Superficial thrombophlebitis Patient has recurrent thrombophlebitis during long flights.  Recommended use of aspirin as needed for this.  Cherry angioma No red flags.  Patient with scattered cherry angiomas though does have a small area of surrounding ecchymosis on the right arm.  Reassured patient this is benign.  He will let us  know if this does not improve over the next several months.  Constipation No red flags.  Recommended daily fiber supplementation.  Can also use MiraLAX as needed.  Chronic Problems Addressed Today: Hyperthyroidism Follows with endocrinology.  No longer on methimazole .  Check TSH today.  Seborrheic dermatitis Patient had tried some of the 7 members Lotrisone  cream and did well with this.  We will refill this today though did advise patient to only use this sporadically as needed.  Recommended use of over-the-counter antidandruff shampoo such as head and shoulders.  Depending on response to this may consider referral to dermatology.  We discussed reasons to return to care.  Preventative Healthcare: Check labs.  Up-to-date on flu shot.  Up-to-date on colonoscopy.  Patient Counseling(The following topics were reviewed and/or handout was given):  -Nutrition: Stressed importance of moderation in sodium/caffeine intake, saturated fat and cholesterol, caloric balance, sufficient intake of fresh fruits, vegetables, and fiber.  -Stressed the importance of regular exercise.   -Substance Abuse: Discussed cessation/primary prevention of tobacco, alcohol, or other drug use; driving or other dangerous activities under the influence; availability of treatment for abuse.   -Injury prevention: Discussed safety belts, safety helmets, smoke detector, smoking near bedding or upholstery.   -Sexuality: Discussed sexually  transmitted diseases, partner selection, use of condoms, avoidance of unintended pregnancy and contraceptive alternatives.   -Dental health: Discussed importance of regular tooth brushing, flossing, and dental visits.  -Health maintenance and immunizations reviewed. Please refer to Health maintenance section.  Return to care in 1 year for next preventative visit.     Subjective:  HPI:  He has no acute complaints today. Patient is here today for his annual physical.  See assessment / plan for status of chronic conditions.  Discussed the use of AI scribe software for clinical note transcription with the patient, who gave verbal consent to proceed.  History of Present Illness Wesley Black is a 73 year old male who presents for an annual physical exam.  He has a seborrheic dermatitis in his ears, shared with his younger brother and inherited from his mother, for which he uses an ointment containing clotrimazole  and betamethasone  during flare-ups. This treatment resolves symptoms for several weeks, and he requests a prescription for this ointment.  He noticed a small dark spot on his arm, initially the size of a puncture mark, which has since doubled in size. It is not painful, and he is curious if it is blood causing the discoloration.  He received a flu shot in November and inquires about the necessity of a COVID booster this year. He had a mild case of COVID previously, experiencing symptoms similar to a head cold for five days.  He has been off methimazole  for eight to nine months and monitors his thyroid  levels, anticipating a potential need for medication if levels rise. He is currently only taking baby aspirin and occasionally uses tazarotene and tolterodine, the latter two to manage overactive bladder symptoms.  He experienced constipation throughout the summer, which resolved  during a trip to [place] and [place], but returned upon his return to the [place] He has since improved and is  considering dietary changes, including fiber supplementation, to manage this issue.  He experiences swelling in his right leg during flights and has a history of a previous injury and weak valve. He has had superficial blood clots during travel.       03/30/2024   11:03 AM  Depression screen PHQ 2/9  Decreased Interest 0  Down, Depressed, Hopeless 0  PHQ - 2 Score 0    There are no preventive care reminders to display for this patient.   ROS: Per HPI, otherwise a complete review of systems was negative.   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   Past Surgical History:  Procedure Laterality Date   COLONOSCOPY     ENDOVENOUS ABLATION SAPHENOUS VEIN W/ LASER Right 07/28/2020   endovenous laser ablation right greater saphenous vein and stab phlebectomy < 10 incisions right leg by Medford Blade MD    KNEE ARTHROPLASTY Right 2004   LASIK Bilateral 2001   SHOULDER SURGERY Left 1984    Family History  Problem Relation Age of Onset   Hypertension Mother    Arthritis Mother    Hyperlipidemia Mother    Prostate cancer Father    Cancer Father    Hyperlipidemia Father    Hypertension Father    Prostate cancer Brother    Obesity Brother    Hyperthyroidism Brother    Asthma Maternal Grandmother    Hyperlipidemia Maternal Grandfather    Hypertension Maternal Grandfather    Stroke Maternal Grandfather    COPD Paternal Grandfather    Colon cancer Neg Hx    Esophageal  cancer Neg Hx    Rectal cancer Neg Hx    Stomach cancer Neg Hx     Medications- reviewed and updated Current Outpatient Medications  Medication Sig Dispense Refill   aspirin EC 81 MG tablet Take 81 mg by mouth daily. Swallow whole.     clotrimazole -betamethasone  (LOTRISONE ) cream Apply 1 Application topically daily. 30 g 0   tolterodine (DETROL LA) 4 MG 24 hr capsule Take 4 mg by mouth daily.     No current facility-administered medications for this visit.    Allergies-reviewed and updated Allergies[1]  Social History   Socioeconomic History   Marital status: Single    Spouse name: Not on file   Number of children: 0   Years of education: Master's   Highest education level: Master's degree (e.g., MA, MS, MEng, MEd, MSW, MBA)  Occupational History   Occupation: Retired  Tobacco Use   Smoking status: Never   Smokeless tobacco: Never  Vaping Use   Vaping status: Never Used  Substance and Sexual Activity   Alcohol use: Yes    Comment: 2 glasses of wine or beer per month   Drug use: No   Sexual activity: Not on file    Comment: Married  Other Topics Concern   Not  on file  Social History Narrative   Lives at home, separated   Left-handed   Caffeine: 1 glass of tea per day, occasional 7 oz Pepsi (every 2 wks)      Social Drivers of Health   Tobacco Use: Low Risk (03/30/2024)   Patient History    Smoking Tobacco Use: Never    Smokeless Tobacco Use: Never    Passive Exposure: Not on file  Financial Resource Strain: Low Risk (03/26/2024)   Overall Financial Resource Strain (CARDIA)    Difficulty of Paying Living Expenses: Not hard at all  Food Insecurity: No Food Insecurity (03/26/2024)   Epic    Worried About Programme Researcher, Broadcasting/film/video in the Last Year: Never true    Ran Out of Food in the Last Year: Never true  Transportation Needs: No Transportation Needs (03/26/2024)   Epic    Lack of Transportation (Medical): No    Lack of Transportation (Non-Medical): No   Physical Activity: Unknown (03/26/2024)   Exercise Vital Sign    Days of Exercise per Week: 4 days    Minutes of Exercise per Session: Not on file  Stress: No Stress Concern Present (03/26/2024)   [employer] of Occupational Health - Occupational Stress Questionnaire    Feeling of Stress: Only a little  Social Connections: Unknown (03/26/2024)   Social Connection and Isolation Panel    Frequency of Communication with Friends and Family: More than three times a week    Frequency of Social Gatherings with Friends and Family: Twice a week    Attends Religious Services: More than 4 times per year    Active Member of Clubs or Organizations: Yes    Attends Banker Meetings: More than 4 times per year    Marital Status: Patient declined  Depression (PHQ2-9): Low Risk (03/30/2024)   Depression (PHQ2-9)    PHQ-2 Score: 0  Alcohol Screen: Low Risk (03/26/2024)   Alcohol Screen    Last Alcohol Screening Score (AUDIT): 1  Housing: Low Risk (03/26/2024)   Epic    Unable to Pay for Housing in the Last Year: No    Number of Times Moved in the Last Year: 0    Homeless in the Last Year: No  Utilities: Not At Risk (12/26/2023)   Epic    Threatened with loss of utilities: No  Health Literacy: Adequate Health Literacy (12/26/2023)   B1300 Health Literacy    Frequency of need for help with medical instructions: Never        Objective:  Physical Exam: BP 112/62   Pulse (!) 47   Temp (!) 97.1 F (36.2 C) (Temporal)   Ht 6' 3 (1.905 m)   Wt 185 lb (83.9 kg) Comment: patient reported  SpO2 97%   BMI 23.12 kg/m   Body mass index is 23.12 kg/m. Wt Readings from Last 3 Encounters:  03/30/24 185 lb (83.9 kg)  01/08/24 191 lb 12.8 oz (87 kg)  12/26/23 192 lb (87.1 kg)   Gen: NAD, resting comfortably HEENT: TMs normal bilaterally. OP clear. No thyromegaly noted.  CV: RRR with no murmurs appreciated Pulm: NWOB, CTAB with no crackles, wheezes, or rhonchi GI: Normal bowel  sounds present. Soft, Nontender, Nondistended. MSK: no edema, cyanosis, or clubbing noted Skin: warm, dry.  Scattered angiomas noted with small amount of surrounding ecchymosis on one angioma on volar aspect of right arm Neuro: CN2-12 grossly intact. Strength 5/5 in upper and lower extremities. Reflexes symmetric and intact bilaterally.  Psych: Normal  affect and thought content     Cadee Agro M. Kennyth, MD 03/30/2024 11:40 AM     [1] No Known Allergies

## 2024-03-30 NOTE — Assessment & Plan Note (Signed)
 Patient had tried some of the 7 members Lotrisone  cream and did well with this.  We will refill this today though did advise patient to only use this sporadically as needed.  Recommended use of over-the-counter antidandruff shampoo such as head and shoulders.  Depending on response to this may consider referral to dermatology.  We discussed reasons to return to care.

## 2024-03-30 NOTE — Patient Instructions (Signed)
 It was very nice to see you today!  VISIT SUMMARY: You came in for your annual physical exam. We discussed your chronic ear condition, a new dark spot on your arm, your thyroid  levels, overactive bladder, constipation, and swelling in your right leg during flights. We also reviewed your vaccinations and general health maintenance.  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 -Use the prescribed clotrimazole  and betamethasone  ointment for flare-ups, limited to two days of use. -Use anti-dandruff shampoo twice weekly.  HYPERTHYROIDISM: You have been off methimazole  for 8-9 months and are currently monitoring your thyroid  levels. -Continue monitoring your thyroid  levels.  OVERACTIVE BLADDER: You manage this condition with tolterodine and occasional Gemtesa. -Continue taking tolterodine 2-3 times weekly and use Gemtesa as needed.  CONSTIPATION: You have intermittent constipation, likely related to diet and age. -Start a fiber supplement like Metamucil or Benefiber. -Consider using Miralax if needed.  SUPERFICIAL THROMBOPHLEBITIS: You experience swelling in your right leg during flights and have a history of superficial blood clots. -Consider taking full dose aspirin before flights.  GENERAL HEALTH MAINTENANCE: We reviewed your vaccinations and general health maintenance. -You are up to date on your colonoscopy. -We discussed flu vaccination and COVID booster; you may get the COVID booster at a pharmacy. -Comprehensive blood work has been ordered, including PSA, A1c, cholesterol, blood counts, kidney and liver function, electrolytes, and thyroid  function.  Return in about 1 year (around 03/30/2025) for Annual Physical.   Take care, Dr Kennyth  PLEASE NOTE:  If you had any lab tests, please let us  know if you have not heard back within a few days. You may see your results on mychart before we have a chance to review them but we will  give you a call once they are reviewed by us .   If we ordered any referrals today, please let us  know if you have not heard from their office within the next week.   If you had any urgent prescriptions sent in today, please check with the pharmacy within an hour of our visit to make sure the prescription was transmitted appropriately.   Please try these tips to maintain a healthy lifestyle:  Eat at least 3 REAL meals and 1-2 snacks per day.  Aim for no more than 5 hours between eating.  If you eat breakfast, please do so within one hour of getting up.   Each meal should contain half fruits/vegetables, one quarter protein, and one quarter carbs (no bigger than a computer mouse)  Cut down on sweet beverages. This includes juice, soda, and sweet tea.   Drink at least 1 glass of water with each meal and aim for at least 8 glasses per day  Exercise at least 150 minutes every week.    Preventive Care 15 Years and Older, Male Preventive care refers to lifestyle choices and visits with your health care provider that can promote health and wellness. Preventive care visits are also called wellness exams. What can I expect for my preventive care visit? Counseling During your preventive care visit, your health care provider may ask about your: Medical history, including: Past medical problems. Family medical history. History of falls. Current health, including: Emotional well-being. Home life and relationship well-being. Sexual activity. Memory and ability to understand (cognition). Lifestyle, including: Alcohol, nicotine or tobacco, and drug use. Access to firearms. Diet, exercise, and sleep habits. Work and work astronomer. Sunscreen use. Safety issues such as seatbelt and bike helmet use. Physical  exam Your health care provider will check your: Height and weight. These may be used to calculate your BMI (body mass index). BMI is a measurement that tells if you are at a healthy  weight. Waist circumference. This measures the distance around your waistline. This measurement also tells if you are at a healthy weight and may help predict your risk of certain diseases, such as type 2 diabetes and high blood pressure. Heart rate and blood pressure. Body temperature. Skin for abnormal spots. What immunizations do I need?  Vaccines are usually given at various ages, according to a schedule. Your health care provider will recommend vaccines for you based on your age, medical history, and lifestyle or other factors, such as travel or where you work. What tests do I need? Screening Your health care provider may recommend screening tests for certain conditions. This may include: Lipid and cholesterol levels. Diabetes screening. This is done by checking your blood sugar (glucose) after you have not eaten for a while (fasting). Hepatitis C test. Hepatitis B test. HIV (human immunodeficiency virus) test. STI (sexually transmitted infection) testing, if you are at risk. Lung cancer screening. Colorectal cancer screening. Prostate cancer screening. Abdominal aortic aneurysm (AAA) screening. You may need this if you are a current or former smoker. Talk with your health care provider about your test results, treatment options, and if necessary, the need for more tests. Follow these instructions at home: Eating and drinking  Eat a diet that includes fresh fruits and vegetables, whole grains, lean protein, and low-fat dairy products. Limit your intake of foods with high amounts of sugar, saturated fats, and salt. Take vitamin and mineral supplements as recommended by your health care provider. Do not drink alcohol if your health care provider tells you not to drink. If you drink alcohol: Limit how much you have to 0-2 drinks a day. Know how much alcohol is in your drink. In the [place] one drink equals one 12 oz bottle of beer (355 mL), one 5 oz glass of wine (148 mL), or one 1 oz  glass of hard liquor (44 mL). Lifestyle Brush your teeth every morning and night with fluoride toothpaste. Floss one time each day. Exercise for at least 30 minutes 5 or more days each week. Do not use any products that contain nicotine or tobacco. These products include cigarettes, chewing tobacco, and vaping devices, such as e-cigarettes. If you need help quitting, ask your health care provider. Do not use drugs. If you are sexually active, practice safe sex. Use a condom or other form of protection to prevent STIs. Take aspirin only as told by your health care provider. Make sure that you understand how much to take and what form to take. Work with your health care provider to find out whether it is safe and beneficial for you to take aspirin daily. Ask your health care provider if you need to take a cholesterol-lowering medicine (statin). Find healthy ways to manage stress, such as: Meditation, yoga, or listening to music. Journaling. Talking to a trusted person. Spending time with friends and family. Safety Always wear your seat belt while driving or riding in a vehicle. Do not drive: If you have been drinking alcohol. Do not ride with someone who has been drinking. When you are tired or distracted. While texting. If you have been using any mind-altering substances or drugs. Wear a helmet and other protective equipment during sports activities. If you have firearms in your house, make sure you follow all  gun safety procedures. Minimize exposure to UV radiation to reduce your risk of skin cancer. What's next? Visit your health care provider once a year for an annual wellness visit. Ask your health care provider how often you should have your eyes and teeth checked. Stay up to date on all vaccines. This information is not intended to replace advice given to you by your health care provider. Make sure you discuss any questions you have with your health care provider. Document Revised:  09/28/2020 Document Reviewed: 09/28/2020 Elsevier Patient Education  2024 [employer].

## 2024-03-31 ENCOUNTER — Encounter: Payer: Self-pay | Admitting: Family Medicine

## 2024-03-31 NOTE — Telephone Encounter (Signed)
 Please advise

## 2024-04-02 ENCOUNTER — Ambulatory Visit: Payer: Self-pay | Admitting: Family Medicine

## 2024-04-02 NOTE — Telephone Encounter (Signed)
 Please see note.

## 2024-04-02 NOTE — Progress Notes (Signed)
 His TSH is extremely low.  This is probably due to the hyperthyroidism coming back.  It would be a good idea for him to see his endocrinologist to discuss management options.  He should let us  know if he is developing any symptoms such as palpitations, anxiety, tremors, etc.  His white blood cell count was also a little bit low.  This may be related to his thyroid .  Recommend he come back in a month recheck CBC with differential.  All of his other labs are stable.  Do not need to make any other adjustments to treatment plan at this time.  We can recheck everything else in a year.

## 2024-04-03 NOTE — Telephone Encounter (Signed)
 It is very possible those symptoms could be coming from his overactive thyroid .  He should let us  know if not improving with treatment.

## 2025-01-04 ENCOUNTER — Ambulatory Visit

## 2025-04-01 ENCOUNTER — Encounter: Admitting: Family Medicine
